# Patient Record
Sex: Female | Born: 1960
Health system: Southern US, Community
[De-identification: ages and names within clinical notes are randomized; demographics above are authoritative.]

## PROBLEM LIST (undated history)

## (undated) DIAGNOSIS — I10 Essential (primary) hypertension: Secondary | ICD-10-CM

## (undated) DIAGNOSIS — F329 Major depressive disorder, single episode, unspecified: Secondary | ICD-10-CM

## (undated) DIAGNOSIS — F32A Depression, unspecified: Secondary | ICD-10-CM

## (undated) DIAGNOSIS — I82409 Acute embolism and thrombosis of unspecified deep veins of unspecified lower extremity: Secondary | ICD-10-CM

## (undated) DIAGNOSIS — E785 Hyperlipidemia, unspecified: Secondary | ICD-10-CM

## (undated) DIAGNOSIS — IMO0001 Reserved for inherently not codable concepts without codable children: Secondary | ICD-10-CM

## (undated) DIAGNOSIS — F419 Anxiety disorder, unspecified: Secondary | ICD-10-CM

## (undated) DIAGNOSIS — E039 Hypothyroidism, unspecified: Secondary | ICD-10-CM

## (undated) HISTORY — DX: Essential (primary) hypertension: I10

## (undated) HISTORY — DX: Hyperlipidemia, unspecified: E78.5

## (undated) HISTORY — PX: HERNIA REPAIR: SHX51

## (undated) HISTORY — PX: BACK SURGERY: SHX140

---

## 1991-06-02 HISTORY — PX: KNEE SURGERY: SHX244

## 2008-03-05 ENCOUNTER — Ambulatory Visit: Payer: Self-pay | Admitting: Obstetrics and Gynecology

## 2009-03-21 ENCOUNTER — Ambulatory Visit: Payer: Self-pay | Admitting: Obstetrics and Gynecology

## 2009-03-26 ENCOUNTER — Ambulatory Visit: Payer: Self-pay | Admitting: Obstetrics and Gynecology

## 2009-03-27 ENCOUNTER — Ambulatory Visit: Payer: Self-pay | Admitting: Obstetrics and Gynecology

## 2010-04-03 ENCOUNTER — Ambulatory Visit: Payer: Self-pay | Admitting: Nurse Practitioner

## 2010-05-02 ENCOUNTER — Ambulatory Visit: Payer: Self-pay | Admitting: Gastroenterology

## 2011-04-30 ENCOUNTER — Ambulatory Visit: Payer: Self-pay

## 2011-05-08 ENCOUNTER — Ambulatory Visit: Payer: Self-pay

## 2012-12-20 ENCOUNTER — Ambulatory Visit: Payer: Self-pay | Admitting: Obstetrics and Gynecology

## 2013-12-21 ENCOUNTER — Ambulatory Visit: Payer: Self-pay | Admitting: Obstetrics and Gynecology

## 2014-03-12 ENCOUNTER — Ambulatory Visit: Payer: Self-pay

## 2014-03-19 ENCOUNTER — Ambulatory Visit: Payer: Self-pay

## 2014-03-26 ENCOUNTER — Ambulatory Visit: Payer: Self-pay

## 2014-04-09 ENCOUNTER — Ambulatory Visit: Payer: Self-pay

## 2014-04-24 ENCOUNTER — Ambulatory Visit: Payer: Self-pay

## 2014-12-19 ENCOUNTER — Other Ambulatory Visit: Payer: Self-pay | Admitting: Obstetrics and Gynecology

## 2014-12-19 DIAGNOSIS — Z1231 Encounter for screening mammogram for malignant neoplasm of breast: Secondary | ICD-10-CM

## 2014-12-19 DIAGNOSIS — Z13820 Encounter for screening for osteoporosis: Secondary | ICD-10-CM

## 2015-01-08 ENCOUNTER — Ambulatory Visit
Admission: RE | Admit: 2015-01-08 | Discharge: 2015-01-08 | Disposition: A | Discharge status: Home / Self Care | Payer: 59 | Source: Ambulatory Visit | Attending: Obstetrics and Gynecology | Admitting: Obstetrics and Gynecology

## 2015-01-08 DIAGNOSIS — Z1231 Encounter for screening mammogram for malignant neoplasm of breast: Secondary | ICD-10-CM | POA: Diagnosis not present

## 2015-04-11 ENCOUNTER — Other Ambulatory Visit: Payer: Self-pay | Admitting: Neurological Surgery

## 2015-04-29 ENCOUNTER — Telehealth: Payer: Self-pay | Admitting: Vascular Surgery

## 2015-04-29 NOTE — Telephone Encounter (Addendum)
-----   Message from Denman George, RN sent at 04/24/2015  6:04 PM EST ----- Regarding: needs office consult with Dr. Zara Chess: [phone] Please schedule new pt. Consult with Dr. Scot Dock, prior to ALIF scheduled on 06/03/14.  Please remind pt. to bring copy of LS spine films with her.    notified patient of appt. on 05-22-15 at 10:00 for a consult with dr. Scot Dock, reminded her to bring  ls spine films, mailed np info

## 2015-05-01 ENCOUNTER — Other Ambulatory Visit: Payer: Self-pay

## 2015-05-07 ENCOUNTER — Encounter: Payer: Self-pay | Admitting: Vascular Surgery

## 2015-05-17 ENCOUNTER — Ambulatory Visit
Admission: RE | Admit: 2015-05-17 | Discharge: 2015-05-17 | Disposition: A | Discharge status: Home / Self Care | Payer: 59 | Source: Ambulatory Visit | Attending: Obstetrics and Gynecology | Admitting: Obstetrics and Gynecology

## 2015-05-17 ENCOUNTER — Encounter: Payer: Self-pay | Admitting: Vascular Surgery

## 2015-05-17 DIAGNOSIS — R002 Palpitations: Secondary | ICD-10-CM | POA: Insufficient documentation

## 2015-05-22 ENCOUNTER — Encounter: Payer: Self-pay | Admitting: Vascular Surgery

## 2015-05-22 ENCOUNTER — Ambulatory Visit (INDEPENDENT_AMBULATORY_CARE_PROVIDER_SITE_OTHER): Payer: Worker's Compensation | Admitting: Vascular Surgery

## 2015-05-22 VITALS — BP 132/71 | HR 75 | Temp 98.9°F | Resp 16 | Ht 64.5 in | Wt 137.0 lb

## 2015-05-22 DIAGNOSIS — M5137 Other intervertebral disc degeneration, lumbosacral region: Secondary | ICD-10-CM

## 2015-05-22 NOTE — Progress Notes (Signed)
[facility] [place]  Patient name: Ashley Hurst MRN: [id number] DOB: January 28, 1961 Sex: female  REASON FOR CONSULT: Evaluate for anterior retroperitoneal approach to L4-L5 and L5-S1. Referred by Dr. Kristeen Miss.  HPI: Ashley Hurst is a 54 y.o. female, who injured her back at work on 03/12/2014. She has failed conservative treatment and is now being considered for anterior lumbar interbody fusion at the L4-L5 and L5-S1 level. She has significant low back pain which is fairly constant and is aggravated by activity and relieved somewhat with rest. Her back pain has significantly limited her ability to work.  I have reviewed the records from Dr. Clarice Pole office. She has disc degenerative changes at L4-L5 and L5-S1.  She's had a previous umbilical hernia repair.  Her risk factors for peripheral vascular disease include hypertension and hyperlipidemia. She denies any history of diabetes, family history of premature cardiovascular disease, or tobacco use.  Past Medical History  Diagnosis Date  . Hypertension   . Hyperlipidemia     No family history on file.  There is no family history of premature cardiovascular disease.  SOCIAL HISTORY: Social History   Social History  . Marital Status: Single    Spouse Name: N/A  . Number of Children: N/A  . Years of Education: N/A   Occupational History  . Not on file.   Social History Main Topics  . Smoking status: Not on file  . Smokeless tobacco: Not on file  . Alcohol Use: Not on file  . Drug Use: Not on file  . Sexual Activity: Not on file   Other Topics Concern  . Not on file   Social History Narrative    Not on File  Current Outpatient Prescriptions  Medication Sig Dispense Refill  . ALPRAZolam (XANAX) 1 MG tablet Take 0.5 mg by mouth 2 (two) times daily. No frequency of medications is noted in list provided by Dr Landis Gandy  [facility]    . aspirin EC 81 MG tablet Take 81 mg by  mouth every morning.    . busPIRone (BUSPAR) 10 MG tablet Take 10 mg by mouth 2 (two) times daily.    . citalopram (CELEXA) 20 MG tablet Take 20 mg by mouth every morning.  3  . cyanocobalamin 1000 MCG tablet Take 1,000 mcg by mouth daily.    Marland Kitchen estradiol (ESTRACE) 1 MG tablet Take 1 mg by mouth daily.     . ferrous sulfate 325 (65 FE) MG tablet Take 325 mg by mouth daily.  1  . fluticasone (FLONASE) 50 MCG/ACT nasal spray Place 1 spray into both nostrils daily.     Marland Kitchen levothyroxine (SYNTHROID, LEVOTHROID) 50 MCG tablet Take 50 mcg by mouth daily before breakfast.    . lisinopril-hydrochlorothiazide (PRINZIDE,ZESTORETIC) 20-25 MG tablet Take 1 tablet by mouth daily.     . meloxicam (MOBIC) 15 MG tablet Take 15 mg by mouth daily.     . Multiple Vitamins-Minerals (COMPLETE MULTIVITAMIN/MINERAL PO) Take 1 tablet by mouth every morning.    . Omega-3 Fatty Acids (FISH OIL) 600 MG CAPS Take 600 mg by mouth daily.    . traMADol (ULTRAM) 50 MG tablet Take by mouth every 6 (six) hours as needed.  3  . zolpidem (AMBIEN) 10 MG tablet Take 10 mg by mouth at bedtime.      No current facility-administered medications for this visit.    REVIEW OF SYSTEMS:  [X]  denotes positive finding, [ ]  denotes negative  finding Cardiac  Comments:  Chest pain or chest pressure:    Shortness of breath upon exertion:    Short of breath when lying flat:    Irregular heart rhythm:        Vascular    Pain in calf, thigh, or hip brought on by ambulation:    Pain in feet at night that wakes you up from your sleep:     Blood clot in your veins:    Leg swelling:  X       Pulmonary    Oxygen at home:    Productive cough:     Wheezing:         Neurologic    Sudden weakness in arms or legs:     Sudden numbness in arms or legs:     Sudden onset of difficulty speaking or slurred speech:    Temporary loss of vision in one eye:     Problems with dizziness:         Gastrointestinal    Blood in stool:     Vomited blood:          Genitourinary    Burning when urinating:     Blood in urine:        Psychiatric    Major depression:         Hematologic    Bleeding problems:    Problems with blood clotting too easily:        Skin    Rashes or ulcers:        Constitutional    Fever or chills:      PHYSICAL EXAM: There were no vitals filed for this visit.  GENERAL: The patient is a well-nourished female, in no acute distress. The vital signs are documented above. CARDIAC: There is a regular rate and rhythm.  VASCULAR: I do not detect carotid bruits. He has palpable femoral, popliteal, and pedal pulses bilaterally. PULMONARY: There is good air exchange bilaterally without wheezing or rales. ABDOMEN: Soft and non-tender with normal pitched bowel sounds.  MUSCULOSKELETAL: There are no major deformities or cyanosis. NEUROLOGIC: No focal weakness or paresthesias are detected. SKIN: There are no ulcers or rashes noted. PSYCHIATRIC: The patient has a normal affect.  DATA:  I have reviewed her x-rays which she brought with her which show degenerative changes at L4-L5 and L5-S1.  MEDICAL ISSUES:  DEGENERATIVE DISC DISEASE L4-L5 AND L5-S1: The patient appears to be a reasonable candidate for anterior retroperitoneal exposure for ALIF. I have reviewed our role in exposure of the spine in order to allow anterior lumbar interbody fusion at the appropriate levels. We have discussed the potential complications of surgery, including but not limited to, arterial or venous injury, thrombosis, or bleeding. We have also discussed the potential risks of wound healing problems, the development of a hernia, nerve injury, leg swelling, or other unpredictable medical problems. All the patient's questions were answered and they are agreeable to proceed. Her surgery is scheduled for 06/04/2015.  [name] [facility] [place]: [phone]

## 2015-05-23 ENCOUNTER — Encounter (HOSPITAL_COMMUNITY)
Admission: RE | Admit: 2015-05-23 | Discharge: 2015-05-23 | Disposition: A | Discharge status: Home / Self Care | Payer: Worker's Compensation | Source: Ambulatory Visit | Attending: Neurological Surgery | Admitting: Neurological Surgery

## 2015-05-23 ENCOUNTER — Encounter (HOSPITAL_COMMUNITY): Payer: Self-pay

## 2015-05-23 DIAGNOSIS — Z01818 Encounter for other preprocedural examination: Secondary | ICD-10-CM | POA: Diagnosis not present

## 2015-05-23 DIAGNOSIS — E039 Hypothyroidism, unspecified: Secondary | ICD-10-CM | POA: Insufficient documentation

## 2015-05-23 DIAGNOSIS — F419 Anxiety disorder, unspecified: Secondary | ICD-10-CM | POA: Insufficient documentation

## 2015-05-23 DIAGNOSIS — E785 Hyperlipidemia, unspecified: Secondary | ICD-10-CM | POA: Diagnosis not present

## 2015-05-23 DIAGNOSIS — Z79899 Other long term (current) drug therapy: Secondary | ICD-10-CM | POA: Diagnosis not present

## 2015-05-23 DIAGNOSIS — F329 Major depressive disorder, single episode, unspecified: Secondary | ICD-10-CM | POA: Insufficient documentation

## 2015-05-23 DIAGNOSIS — M5416 Radiculopathy, lumbar region: Secondary | ICD-10-CM | POA: Diagnosis not present

## 2015-05-23 DIAGNOSIS — Z7982 Long term (current) use of aspirin: Secondary | ICD-10-CM | POA: Insufficient documentation

## 2015-05-23 DIAGNOSIS — Z8249 Family history of ischemic heart disease and other diseases of the circulatory system: Secondary | ICD-10-CM | POA: Diagnosis not present

## 2015-05-23 DIAGNOSIS — Z01812 Encounter for preprocedural laboratory examination: Secondary | ICD-10-CM | POA: Insufficient documentation

## 2015-05-23 DIAGNOSIS — I10 Essential (primary) hypertension: Secondary | ICD-10-CM | POA: Diagnosis not present

## 2015-05-23 HISTORY — DX: Hypothyroidism, unspecified: E03.9

## 2015-05-23 HISTORY — DX: Reserved for inherently not codable concepts without codable children: IMO0001

## 2015-05-23 HISTORY — DX: Anxiety disorder, unspecified: F41.9

## 2015-05-23 HISTORY — DX: Major depressive disorder, single episode, unspecified: F32.9

## 2015-05-23 HISTORY — DX: Depression, unspecified: F32.A

## 2015-05-23 LAB — BASIC METABOLIC PANEL
Anion gap: 8 (ref 5–15)
BUN: 11 mg/dL (ref 6–20)
CHLORIDE: 104 mmol/L (ref 101–111)
CO2: 27 mmol/L (ref 22–32)
CREATININE: 0.64 mg/dL (ref 0.44–1.00)
Calcium: 9.5 mg/dL (ref 8.9–10.3)
GFR calc Af Amer: >60 mL/min (ref >60)
GLUCOSE: 100 mg/dL — AB (ref 65–99)
POTASSIUM: 3.8 mmol/L (ref 3.5–5.1)
SODIUM: 139 mmol/L (ref 135–145)

## 2015-05-23 LAB — CBC
HCT: 34.9 % — ABNORMAL LOW (ref 36.0–46.0)
Hemoglobin: 10.8 g/dL — ABNORMAL LOW (ref 12.0–15.0)
MCH: 29.1 pg (ref 26.0–34.0)
MCHC: 30.9 g/dL (ref 30.0–36.0)
MCV: 94.1 fL (ref 78.0–100.0)
PLATELETS: 205 10*3/uL (ref 150–400)
RBC: 3.71 MIL/uL — AB (ref 3.87–5.11)
RDW: 15.6 % — ABNORMAL HIGH (ref 11.5–15.5)
WBC: 5.6 10*3/uL (ref 4.0–10.5)

## 2015-05-23 LAB — SURGICAL PCR SCREEN
MRSA, PCR: POSITIVE — AB
Staphylococcus aureus: POSITIVE — AB

## 2015-05-23 MED ORDER — CHLORHEXIDINE GLUCONATE 4 % EX LIQD: 60.0000 mL | Freq: Once | CUTANEOUS | Status: DC

## 2015-05-23 NOTE — Pre-Procedure Instructions (Signed)
    Lisbeth Renshaw Rumbold  05/23/2015      [id number] [address], [place] [address] Phone: [phone] Fax: [phone]    Your procedure is scheduled on 06/03/14.  Report to [facility] Admitting at 530 A.M.  Call this number if you have problems the morning of surgery:  [phone]   Remember:  Do not eat food or drink liquids after midnight.  Take these medicines the morning of surgery with A SIP OF WATER --celexa,estrace,flonase,synthroid,ultram   Do not wear jewelry, make-up or nail polish.  Do not wear lotions, powders, or perfumes.  You may wear deodorant.  Do not shave 48 hours prior to surgery.  Men may shave face and neck.  Do not bring valuables to the hospital.  [facility] is not responsible for any belongings or valuables.  Contacts, dentures or bridgework may not be worn into surgery.  Leave your suitcase in the car.  After surgery it may be brought to your room.  For patients admitted to the hospital, discharge time will be determined by your treatment team.  Patients discharged the day of surgery will not be allowed to drive home.   Name and phone number of your driver:   Special instructions:    Please read over the following fact sheets that you were given. Pain Booklet, Coughing and Deep Breathing, MRSA Information and Surgical Site Infection Prevention

## 2015-05-23 NOTE — Progress Notes (Signed)
Pt recently had ekg. She had some SOB. But it was possibly a panic attack. Shelby Dubin to review ekg. Pt would like a phone call if there is aproblem,as she has not hear from PCP.

## 2015-05-24 NOTE — Progress Notes (Addendum)
Anesthesia Chart Review: Patient is a 53 year old female scheduled for L4-5, L5-S1 artifical disc replacement  (Dr. Ellene Route) with anterior approach (Dr. Scot Dock) on 06/04/15. OR room is booked from [phone]. Case is workman's comp. Injury occurred 15 month ago. 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   History includes nonsmoker, hypertension, hyperlipidemia, hypothyroidism, depression, anxiety, shortness of breath, umbilical hernia repair, knee surgery. She is perimenapausal with irregular, heavy menses. She was recently started on iron for "low iron." She reported an episode of acute palpitations/heart racing associated with SOB on Thanksgiving day while vacuuming. She denied chest pain. She had to rest. All together symptoms lasted approximately four hours. She did not seek immediate medical attention, but mentioned it to her GYN Dr. Elgie Collard at her 05/07/15 follow-up. She ordered an EKG which was down at [facility] out-patient on 05/17/15. Patient has never heard back from Dr. Georga Bora regarding the EKG results but said there was mention of a potential need for stress test depending on findings. Patient uses her GYN as a PCP.   Meds include Xanax, aspirin 81 mg, Buspar, Celexa, estradiol, ferrous sulfate, Flonase, levothyroxine, lisinopril-hydrochlorothiazide, fish oil, tramadol, Ambien.  PAT Vitals: T 36.7C, HR 78, BP 118/55, RR 18, O2 sat 98%.  05/17/15 EKG: NSR, LAD, RSR prime or QR pattern in V1 suggests RV conduction delay, septal infarct (age undetermined). No previous tracing in [place] or [place]. Patient denied prior EKG or other cardiac studies. She reports no further tachycardia/SOB episodes since Thanksgiving. No CP. No syncope. Her LLE tends to get swollen with prolonged standing since her injury last year. Reports she has still been able to be fairly active, and was able to use her leaf blower for about 30 minutes last  week. Prior to Labor Day, she was swimming regularly. Her family history includes a father who had HTN and what sounds like metastatic cancer but died at age 68 of a MI. Her mother died from cancer and complications from a fall. Her brother died from complications of drug abuse. She has one living sister.   Preoperative labs noted. H/H 10.8/34.9. Glucose 100. Cr 0.64.   Discussed with anesthesiologist Dr. Therisa Doyne. Patient is scheduled for a 6+ hour surgery. CAD risk factors include HTN, HLD, family history of CAD. She had tachycardia event with SOB on Thanksgiving Day, but did not seek immediate attention. Symptoms have not recurred. Recent EKG shows septal Q waves (consider infarct, or may be related to incomplete RBBB pattern). She also has some sporadic T wave inversion. There is also question that Dr. Georga Bora said she may need a stress test. We would be inclined to recommend preoperative stress test (or either cardiology evaluation). I will attempt to contact Dr. Suszanne Finch on 05/28/15 when her office reopens to discuss further. Dr. Clarice Pole office is now closed, so I'll plan to update him as well next week.  George Hugh [facility] Short Stay Center/Anesthesiology Phone [phone] 05/24/2015 4:49 PM  Addendum: I attempted to call and speak with Dr. Georga Bora this morning, but could not get an answer. I did fax my note along with recent EKG tracing for her review. This afternoon, I received a message from [place], [place] with PAT stating that Dr. Georga Bora' office had called back and wanted [place] to arrange cardiology follow-up. I have notified Jessica at Dr. Clarice Pole office. She will make a referral.   Myra Gianotti, PA-C [facility] Short Stay Center/Anesthesiology Phone (  [phone] 05/28/2015 5:20 PM

## 2015-05-30 ENCOUNTER — Other Ambulatory Visit: Payer: Self-pay | Admitting: Cardiology

## 2015-05-30 DIAGNOSIS — R079 Chest pain, unspecified: Secondary | ICD-10-CM

## 2015-05-31 ENCOUNTER — Encounter (HOSPITAL_COMMUNITY): Admission: RE | Admit: 2015-05-31 | Payer: Worker's Compensation | Source: Ambulatory Visit

## 2015-05-31 ENCOUNTER — Encounter (HOSPITAL_COMMUNITY)
Admission: RE | Admit: 2015-05-31 | Discharge: 2015-05-31 | Disposition: A | Discharge status: Home / Self Care | Payer: Worker's Compensation | Source: Ambulatory Visit | Attending: Cardiology | Admitting: Cardiology

## 2015-05-31 DIAGNOSIS — R079 Chest pain, unspecified: Secondary | ICD-10-CM | POA: Insufficient documentation

## 2015-05-31 MED ORDER — TECHNETIUM TC 99M SESTAMIBI GENERIC - CARDIOLITE
30.0000 | Freq: Once | INTRAVENOUS | Status: AC | PRN
  Administered 2015-05-31: 30 via INTRAVENOUS

## 2015-05-31 MED ORDER — REGADENOSON 0.4 MG/5ML IV SOLN
INTRAVENOUS | Status: AC
  Filled 2015-05-31: qty 5

## 2015-05-31 MED ORDER — TECHNETIUM TC 99M SESTAMIBI GENERIC - CARDIOLITE
10.0000 | Freq: Once | INTRAVENOUS | Status: AC | PRN
  Administered 2015-05-31: 10 via INTRAVENOUS

## 2015-05-31 MED ORDER — REGADENOSON 0.4 MG/5ML IV SOLN
0.4000 mg | Freq: Once | INTRAVENOUS | Status: AC
  Administered 2015-05-31: 0.4 mg via INTRAVENOUS

## 2015-05-31 NOTE — Progress Notes (Addendum)
Anesthesia Chart Review: Patient is a 54 year old female scheduled for L4-5, L5-S1 artifical disc replacement  (Dr. Ellene Route) with anterior approach (Dr. Scot Dock) on 06/04/15. OR room is booked from [phone]. Case is workman's comp. Injury occurred 15 month ago. 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   History includes nonsmoker, hypertension, hyperlipidemia, hypothyroidism, depression, anxiety, shortness of breath, umbilical hernia repair, knee surgery. She is perimenapausal with irregular, heavy menses. She was recently started on iron for "low iron." She reported an episode of acute palpitations/heart racing associated with SOB on Thanksgiving day while vacuuming. She denied chest pain. She had to rest. All together symptoms lasted approximately four hours. She did not seek immediate medical attention, but mentioned it to her GYN Dr. Elgie Collard at her 05/07/15 follow-up. She ordered an EKG which was down at [facility] out-patient on 05/17/15. Patient has never heard back from Dr. Georga Bora regarding the EKG results but said there was mention of a potential need for stress test depending on findings. Patient uses her GYN as a PCP.   Meds include Xanax, aspirin 81 mg, Buspar, Celexa, estradiol, ferrous sulfate, Flonase, levothyroxine, lisinopril-hydrochlorothiazide, fish oil, tramadol, Ambien.  PAT Vitals: T 36.7C, HR 78, BP 118/55, RR 18, O2 sat 98%.  05/17/15 EKG: NSR, LAD, RSR prime or QR pattern in V1 suggests RV conduction delay, septal infarct (age undetermined). No previous tracing in [place] or [place]. Patient denied prior EKG or other cardiac studies. She reports no further tachycardia/SOB episodes since Thanksgiving. No CP. No syncope. Her LLE tends to get swollen with prolonged standing since her injury last year. Reports she has still been able to be fairly active, and was able to use her leaf blower for about 30 minutes last  week. Prior to Labor Day, she was swimming regularly. Her family history includes a father who had HTN and what sounds like metastatic cancer but died at age 20 of a MI. Her mother died from cancer and complications from a fall. Her brother died from complications of drug abuse. She has one living sister.   Preoperative labs noted. H/H 10.8/34.9. Glucose 100. Cr 0.64.   Discussed with anesthesiologist Dr. Therisa Doyne. Patient is scheduled for a 6+ hour surgery. CAD risk factors include HTN, HLD, family history of CAD. She had tachycardia event with SOB on Thanksgiving Day, but did not seek immediate attention. Symptoms have not recurred. Recent EKG shows septal Q waves (consider infarct, or may be related to incomplete RBBB pattern). She also has some sporadic T wave inversion. There is also question that Dr. Georga Bora said she may need a stress test. We would be inclined to recommend preoperative stress test (or either cardiology evaluation). I will attempt to contact Dr. Suszanne Finch on 05/28/15 when her office reopens to discuss further. Dr. Clarice Pole office is now closed, so I'll plan to update him as well next week.  George Hugh [facility] Short Stay Center/Anesthesiology Phone [phone] 05/31/2015 3:25 PM  Addendum: I attempted to call and speak with Dr. Georga Bora this morning, but could not get an answer. I did fax my note along with recent EKG tracing for her review. This afternoon, I received a message from [place], [place] with PAT stating that Dr. Georga Bora' office had called back and wanted [place] to arrange cardiology follow-up. I have notified Jessica at Dr. Clarice Pole office. She will make a referral.   Myra Gianotti, PA-C [facility] Short Stay Center/Anesthesiology Phone (  [phone] 05/31/2015 3:25 PM  Addendum: Patient was seen by cardiologist Dr. Einar Gip this week. He cleared her following a recent stress test done at [facility] (see below).  05/31/15 Nuclear stress test:  IMPRESSION: 1. No reversible ischemia or infarction. 2. Normal left ventricular wall motion. 3. Left ventricular ejection fraction 75% 4. Low-risk stress test findings*.  *2012 Appropriate Use Criteria for Coronary Revascularization Focused Update: J Am Coll Cardiol. N6492421. http://content.airportbarriers.com.aspx?articleid=1201161  George Hugh [facility] Short Stay Center/Anesthesiology Phone [phone] 05/31/2015 3:26 PM

## 2015-05-31 NOTE — Progress Notes (Signed)
Spoke Ashley Hurst at Dr Clarice Pole office and she stated pt is having ECHO now and she is following up and in contact with Ebony Hail.

## 2015-06-01 LAB — NM MYOCAR MULTI W/SPECT W/WALL MOTION / EF
CHL CUP STRESS STAGE 1 HR: 72 {beats}/min
CHL CUP STRESS STAGE 1 SBP: 146 mmHg
CHL CUP STRESS STAGE 2 HR: 71 {beats}/min
CHL CUP STRESS STAGE 3 HR: 80 {beats}/min
CHL CUP STRESS STAGE 3 SBP: 134 mmHg
CHL CUP STRESS STAGE 3 SPEED: 0 mph
CHL CUP STRESS STAGE 4 GRADE: 0 %
CHL CUP STRESS STAGE 4 HR: 81 {beats}/min
CHL CUP STRESS STAGE 5 HR: 79 {beats}/min
CHL CUP STRESS STAGE 6 GRADE: 0 %
CHL CUP STRESS STAGE 6 HR: 78 {beats}/min
CHL CUP STRESS STAGE 6 SBP: 133 mmHg
CHL CUP STRESS STAGE 6 SPEED: 0 mph
CSEPEW: 1 METS
CSEPPMHR: 49 %
Peak HR: 81 {beats}/min
Stage 1 DBP: 86 mmHg
Stage 1 Grade: 0 %
Stage 1 Speed: 0 mph
Stage 2 Grade: 0 %
Stage 2 Speed: 0 mph
Stage 3 DBP: 81 mmHg
Stage 3 Grade: 0 %
Stage 4 Speed: 0 mph
Stage 5 Grade: 0 %
Stage 5 Speed: 0 mph
Stage 6 DBP: 79 mmHg

## 2015-06-03 MED ORDER — CEFAZOLIN SODIUM-DEXTROSE 2-3 GM-% IV SOLR
2.0000 g | INTRAVENOUS | Status: AC
  Administered 2015-06-04 (×2): 2 g via INTRAVENOUS
  Filled 2015-06-03: qty 50

## 2015-06-04 ENCOUNTER — Encounter (HOSPITAL_COMMUNITY): Payer: Self-pay | Admitting: Anesthesiology

## 2015-06-04 ENCOUNTER — Inpatient Hospital Stay (HOSPITAL_COMMUNITY): Payer: Worker's Compensation

## 2015-06-04 ENCOUNTER — Encounter (HOSPITAL_COMMUNITY)
Admission: RE | Disposition: A | Discharge status: Home / Self Care | Payer: PRIVATE HEALTH INSURANCE | Source: Ambulatory Visit | Attending: Neurological Surgery

## 2015-06-04 ENCOUNTER — Inpatient Hospital Stay (HOSPITAL_COMMUNITY)
Admission: RE | Admit: 2015-06-04 | Discharge: 2015-06-07 | DRG: 518 | Disposition: A | Discharge status: Home / Self Care | Payer: Worker's Compensation | Source: Ambulatory Visit | Attending: Neurological Surgery | Admitting: Neurological Surgery

## 2015-06-04 ENCOUNTER — Inpatient Hospital Stay (HOSPITAL_COMMUNITY): Payer: Worker's Compensation | Admitting: Anesthesiology

## 2015-06-04 ENCOUNTER — Inpatient Hospital Stay (HOSPITAL_COMMUNITY): Payer: Worker's Compensation | Admitting: Vascular Surgery

## 2015-06-04 DIAGNOSIS — I10 Essential (primary) hypertension: Secondary | ICD-10-CM | POA: Diagnosis present

## 2015-06-04 DIAGNOSIS — Z7982 Long term (current) use of aspirin: Secondary | ICD-10-CM

## 2015-06-04 DIAGNOSIS — M549 Dorsalgia, unspecified: Secondary | ICD-10-CM | POA: Diagnosis present

## 2015-06-04 DIAGNOSIS — E785 Hyperlipidemia, unspecified: Secondary | ICD-10-CM | POA: Diagnosis present

## 2015-06-04 DIAGNOSIS — M5136 Other intervertebral disc degeneration, lumbar region: Secondary | ICD-10-CM | POA: Diagnosis not present

## 2015-06-04 DIAGNOSIS — M4727 Other spondylosis with radiculopathy, lumbosacral region: Secondary | ICD-10-CM | POA: Diagnosis present

## 2015-06-04 DIAGNOSIS — M5117 Intervertebral disc disorders with radiculopathy, lumbosacral region: Principal | ICD-10-CM | POA: Diagnosis present

## 2015-06-04 DIAGNOSIS — I739 Peripheral vascular disease, unspecified: Secondary | ICD-10-CM | POA: Diagnosis present

## 2015-06-04 DIAGNOSIS — M5416 Radiculopathy, lumbar region: Secondary | ICD-10-CM | POA: Diagnosis present

## 2015-06-04 DIAGNOSIS — D62 Acute posthemorrhagic anemia: Secondary | ICD-10-CM | POA: Diagnosis not present

## 2015-06-04 HISTORY — PX: ANTERIOR LUMBAR DISC ARTHROPLASTY: SHX5721

## 2015-06-04 HISTORY — PX: ABDOMINAL EXPOSURE: SHX5708

## 2015-06-04 LAB — CBC
HCT: 22.5 % — ABNORMAL LOW (ref 36.0–46.0)
HEMOGLOBIN: 7 g/dL — AB (ref 12.0–15.0)
MCH: 29 pg (ref 26.0–34.0)
MCHC: 31.1 g/dL (ref 30.0–36.0)
MCV: 93.4 fL (ref 78.0–100.0)
Platelets: 122 10*3/uL — ABNORMAL LOW (ref 150–400)
RBC: 2.41 MIL/uL — AB (ref 3.87–5.11)
RDW: 14.8 % (ref 11.5–15.5)
WBC: 8.2 10*3/uL (ref 4.0–10.5)

## 2015-06-04 LAB — ABO/RH: ABO/RH(D): A NEG

## 2015-06-04 SURGERY — ANTERIOR LUMBAR DISC ARTHROPLASTY
Anesthesia: General | Site: Abdomen

## 2015-06-04 MED ORDER — FLUTICASONE PROPIONATE 50 MCG/ACT NA SUSP: 1.0000 | Freq: Every day | NASAL | Status: DC

## 2015-06-04 MED ORDER — ONDANSETRON HCL 4 MG/2ML IJ SOLN: 4.0000 mg | INTRAMUSCULAR | Status: DC | PRN

## 2015-06-04 MED ORDER — TRAMADOL HCL 50 MG PO TABS: 50.0000 mg | ORAL_TABLET | Freq: Four times a day (QID) | ORAL | Status: DC | PRN

## 2015-06-04 MED ORDER — HEMOSTATIC AGENTS (NO CHARGE) OPTIME
TOPICAL | Status: DC | PRN
  Administered 2015-06-04: 1 via TOPICAL

## 2015-06-04 MED ORDER — BUSPIRONE HCL 10 MG PO TABS
10.0000 mg | ORAL_TABLET | Freq: Two times a day (BID) | ORAL | Status: DC
  Administered 2015-06-05 – 2015-06-07 (×5): 10 mg via ORAL
  Filled 2015-06-04 (×7): qty 1

## 2015-06-04 MED ORDER — FENTANYL CITRATE (PF) 250 MCG/5ML IJ SOLN
INTRAMUSCULAR | Status: AC
  Filled 2015-06-04: qty 5

## 2015-06-04 MED ORDER — OXYCODONE HCL 5 MG PO TABS: 5.0000 mg | ORAL_TABLET | Freq: Once | ORAL | Status: DC | PRN

## 2015-06-04 MED ORDER — SODIUM CHLORIDE 0.9 % IJ SOLN
3.0000 mL | Freq: Two times a day (BID) | INTRAMUSCULAR | Status: DC
  Administered 2015-06-04 – 2015-06-06 (×5): 3 mL via INTRAVENOUS

## 2015-06-04 MED ORDER — ESTRADIOL 1 MG PO TABS
1.0000 mg | ORAL_TABLET | Freq: Every day | ORAL | Status: DC
  Administered 2015-06-06 – 2015-06-07 (×2): 1 mg via ORAL
  Filled 2015-06-04 (×3): qty 1

## 2015-06-04 MED ORDER — SODIUM CHLORIDE 0.9 % IV SOLN
INTRAVENOUS | Status: DC
  Administered 2015-06-04 – 2015-06-05 (×3): via INTRAVENOUS

## 2015-06-04 MED ORDER — EPHEDRINE SULFATE 50 MG/ML IJ SOLN
INTRAMUSCULAR | Status: AC
  Filled 2015-06-04: qty 1

## 2015-06-04 MED ORDER — OXYCODONE-ACETAMINOPHEN 5-325 MG PO TABS
1.0000 | ORAL_TABLET | ORAL | Status: DC | PRN
  Administered 2015-06-04 – 2015-06-07 (×13): 2 via ORAL
  Filled 2015-06-04 (×13): qty 2

## 2015-06-04 MED ORDER — ROCURONIUM BROMIDE 100 MG/10ML IV SOLN
INTRAVENOUS | Status: DC | PRN
  Administered 2015-06-04: 50 mg via INTRAVENOUS

## 2015-06-04 MED ORDER — ACETAMINOPHEN 325 MG PO TABS: 650.0000 mg | ORAL_TABLET | ORAL | Status: DC | PRN

## 2015-06-04 MED ORDER — CITALOPRAM HYDROBROMIDE 10 MG PO TABS
20.0000 mg | ORAL_TABLET | Freq: Every morning | ORAL | Status: DC
  Administered 2015-06-05 – 2015-06-07 (×3): 20 mg via ORAL
  Filled 2015-06-04: qty 1
  Filled 2015-06-04: qty 2
  Filled 2015-06-04: qty 1
  Filled 2015-06-04: qty 2

## 2015-06-04 MED ORDER — STERILE WATER FOR INJECTION IJ SOLN
INTRAMUSCULAR | Status: AC
  Filled 2015-06-04: qty 10

## 2015-06-04 MED ORDER — LEVOTHYROXINE SODIUM 50 MCG PO TABS
50.0000 ug | ORAL_TABLET | Freq: Every day | ORAL | Status: DC
  Administered 2015-06-05 – 2015-06-07 (×3): 50 ug via ORAL
  Filled 2015-06-04 (×3): qty 1

## 2015-06-04 MED ORDER — VECURONIUM BROMIDE 10 MG IV SOLR
INTRAVENOUS | Status: AC
  Filled 2015-06-04: qty 10

## 2015-06-04 MED ORDER — THROMBIN 5000 UNITS EX SOLR
OROMUCOSAL | Status: DC | PRN
  Administered 2015-06-04: 10 mL via TOPICAL

## 2015-06-04 MED ORDER — SODIUM CHLORIDE 0.9 % IJ SOLN
INTRAMUSCULAR | Status: AC
  Filled 2015-06-04: qty 10

## 2015-06-04 MED ORDER — ACETAMINOPHEN 650 MG RE SUPP: 650.0000 mg | RECTAL | Status: DC | PRN

## 2015-06-04 MED ORDER — OXYCODONE HCL 5 MG/5ML PO SOLN: 5.0000 mg | Freq: Once | ORAL | Status: DC | PRN

## 2015-06-04 MED ORDER — LIDOCAINE HCL (CARDIAC) 20 MG/ML IV SOLN
INTRAVENOUS | Status: DC | PRN
  Administered 2015-06-04: 60 mg via INTRAVENOUS

## 2015-06-04 MED ORDER — SODIUM CHLORIDE 0.9 % IV SOLN
INTRAVENOUS | Status: DC | PRN
  Administered 2015-06-04: 12:00:00 via INTRAVENOUS

## 2015-06-04 MED ORDER — DOCUSATE SODIUM 100 MG PO CAPS
100.0000 mg | ORAL_CAPSULE | Freq: Two times a day (BID) | ORAL | Status: DC
  Administered 2015-06-04 – 2015-06-07 (×6): 100 mg via ORAL
  Filled 2015-06-04 (×7): qty 1

## 2015-06-04 MED ORDER — GLYCOPYRROLATE 0.2 MG/ML IJ SOLN
INTRAMUSCULAR | Status: AC
  Filled 2015-06-04: qty 1

## 2015-06-04 MED ORDER — EPHEDRINE SULFATE 50 MG/ML IJ SOLN
INTRAMUSCULAR | Status: DC | PRN
  Administered 2015-06-04: 5 mg via INTRAVENOUS
  Administered 2015-06-04: 10 mg via INTRAVENOUS
  Administered 2015-06-04: 5 mg via INTRAVENOUS

## 2015-06-04 MED ORDER — DEXAMETHASONE SODIUM PHOSPHATE 10 MG/ML IJ SOLN
INTRAMUSCULAR | Status: DC | PRN
  Administered 2015-06-04: 10 mg via INTRAVENOUS

## 2015-06-04 MED ORDER — PROPOFOL 10 MG/ML IV BOLUS
INTRAVENOUS | Status: DC | PRN
  Administered 2015-06-04: 120 mg via INTRAVENOUS
  Administered 2015-06-04: 80 mg via INTRAVENOUS

## 2015-06-04 MED ORDER — ROCURONIUM BROMIDE 50 MG/5ML IV SOLN
INTRAVENOUS | Status: AC
  Filled 2015-06-04: qty 1

## 2015-06-04 MED ORDER — PROMETHAZINE HCL 25 MG/ML IJ SOLN: 6.2500 mg | INTRAMUSCULAR | Status: DC | PRN

## 2015-06-04 MED ORDER — SODIUM CHLORIDE 0.9 % IV BOLUS (SEPSIS)
1000.0000 mL | Freq: Once | INTRAVENOUS | Status: AC
  Administered 2015-06-04: 1000 mL via INTRAVENOUS

## 2015-06-04 MED ORDER — LACTATED RINGERS IV SOLN
INTRAVENOUS | Status: DC | PRN
  Administered 2015-06-04: 07:00:00 via INTRAVENOUS

## 2015-06-04 MED ORDER — LIDOCAINE HCL (CARDIAC) 20 MG/ML IV SOLN
INTRAVENOUS | Status: AC
  Filled 2015-06-04: qty 5

## 2015-06-04 MED ORDER — MIDAZOLAM HCL 2 MG/2ML IJ SOLN
INTRAMUSCULAR | Status: AC
  Filled 2015-06-04: qty 2

## 2015-06-04 MED ORDER — MELOXICAM 7.5 MG PO TABS
15.0000 mg | ORAL_TABLET | Freq: Every day | ORAL | Status: DC
Start: 2015-06-06 — End: 2015-06-07
  Administered 2015-06-06 – 2015-06-07 (×2): 15 mg via ORAL
  Filled 2015-06-04 (×2): qty 2

## 2015-06-04 MED ORDER — SUGAMMADEX SODIUM 200 MG/2ML IV SOLN
INTRAVENOUS | Status: AC
  Filled 2015-06-04: qty 2

## 2015-06-04 MED ORDER — LABETALOL HCL 5 MG/ML IV SOLN
INTRAVENOUS | Status: DC | PRN
  Administered 2015-06-04: 5 mg via INTRAVENOUS

## 2015-06-04 MED ORDER — LISINOPRIL 20 MG PO TABS
20.0000 mg | ORAL_TABLET | Freq: Every day | ORAL | Status: DC
  Administered 2015-06-04 – 2015-06-07 (×2): 20 mg via ORAL
  Filled 2015-06-04 (×2): qty 1

## 2015-06-04 MED ORDER — BISACODYL 10 MG RE SUPP: 10.0000 mg | Freq: Every day | RECTAL | Status: DC | PRN

## 2015-06-04 MED ORDER — KETOROLAC TROMETHAMINE 15 MG/ML IJ SOLN
15.0000 mg | Freq: Four times a day (QID) | INTRAMUSCULAR | Status: AC
  Administered 2015-06-04 – 2015-06-05 (×5): 15 mg via INTRAVENOUS
  Filled 2015-06-04 (×4): qty 1

## 2015-06-04 MED ORDER — SUGAMMADEX SODIUM 200 MG/2ML IV SOLN
INTRAVENOUS | Status: DC | PRN
  Administered 2015-06-04: 130 mg via INTRAVENOUS

## 2015-06-04 MED ORDER — LORAZEPAM 2 MG/ML IJ SOLN: 0.5000 mg | INTRAMUSCULAR | Status: DC | PRN

## 2015-06-04 MED ORDER — HYDROCHLOROTHIAZIDE 25 MG PO TABS
25.0000 mg | ORAL_TABLET | Freq: Every day | ORAL | Status: DC
  Administered 2015-06-04 – 2015-06-07 (×2): 25 mg via ORAL
  Filled 2015-06-04 (×2): qty 1

## 2015-06-04 MED ORDER — LISINOPRIL-HYDROCHLOROTHIAZIDE 20-25 MG PO TABS: 1.0000 | ORAL_TABLET | Freq: Every day | ORAL | Status: DC

## 2015-06-04 MED ORDER — CEFAZOLIN SODIUM 1-5 GM-% IV SOLN
1.0000 g | Freq: Three times a day (TID) | INTRAVENOUS | Status: AC
  Administered 2015-06-04 – 2015-06-05 (×2): 1 g via INTRAVENOUS
  Filled 2015-06-04 (×2): qty 50

## 2015-06-04 MED ORDER — HYDROMORPHONE HCL 1 MG/ML IJ SOLN
0.2500 mg | INTRAMUSCULAR | Status: DC | PRN
  Administered 2015-06-04: 0.5 mg via INTRAVENOUS

## 2015-06-04 MED ORDER — MENTHOL 3 MG MT LOZG: 1.0000 | LOZENGE | OROMUCOSAL | Status: DC | PRN

## 2015-06-04 MED ORDER — PROPOFOL 10 MG/ML IV BOLUS
INTRAVENOUS | Status: AC
  Filled 2015-06-04: qty 20

## 2015-06-04 MED ORDER — ZOLPIDEM TARTRATE 5 MG PO TABS
5.0000 mg | ORAL_TABLET | Freq: Every day | ORAL | Status: DC
  Administered 2015-06-05 – 2015-06-06 (×2): 5 mg via ORAL
  Filled 2015-06-04 (×2): qty 1

## 2015-06-04 MED ORDER — ALBUMIN HUMAN 5 % IV SOLN
INTRAVENOUS | Status: DC | PRN
  Administered 2015-06-04: 09:00:00 via INTRAVENOUS

## 2015-06-04 MED ORDER — ONDANSETRON HCL 4 MG/2ML IJ SOLN
INTRAMUSCULAR | Status: DC | PRN
  Administered 2015-06-04: 4 mg via INTRAVENOUS

## 2015-06-04 MED ORDER — DEXAMETHASONE SODIUM PHOSPHATE 10 MG/ML IJ SOLN
INTRAMUSCULAR | Status: AC
  Filled 2015-06-04: qty 1

## 2015-06-04 MED ORDER — 0.9 % SODIUM CHLORIDE (POUR BTL) OPTIME
TOPICAL | Status: DC | PRN
  Administered 2015-06-04: 1000 mL

## 2015-06-04 MED ORDER — FLEET ENEMA 7-19 GM/118ML RE ENEM: 1.0000 | ENEMA | Freq: Once | RECTAL | Status: DC | PRN

## 2015-06-04 MED ORDER — LACTATED RINGERS IV SOLN
INTRAVENOUS | Status: DC | PRN
  Administered 2015-06-04 (×3): via INTRAVENOUS

## 2015-06-04 MED ORDER — FENTANYL CITRATE (PF) 100 MCG/2ML IJ SOLN
INTRAMUSCULAR | Status: DC | PRN
  Administered 2015-06-04 (×6): 50 ug via INTRAVENOUS
  Administered 2015-06-04: 100 ug via INTRAVENOUS
  Administered 2015-06-04 (×5): 50 ug via INTRAVENOUS

## 2015-06-04 MED ORDER — LABETALOL HCL 5 MG/ML IV SOLN
INTRAVENOUS | Status: AC
  Filled 2015-06-04: qty 4

## 2015-06-04 MED ORDER — CEFAZOLIN SODIUM-DEXTROSE 2-3 GM-% IV SOLR
INTRAVENOUS | Status: AC
  Filled 2015-06-04: qty 50

## 2015-06-04 MED ORDER — SODIUM CHLORIDE 0.9 % IJ SOLN: 3.0000 mL | INTRAMUSCULAR | Status: DC | PRN

## 2015-06-04 MED ORDER — BACITRACIN 50000 UNITS IM SOLR
INTRAMUSCULAR | Status: DC | PRN
  Administered 2015-06-04: 500 mL

## 2015-06-04 MED ORDER — ONDANSETRON HCL 4 MG/2ML IJ SOLN
INTRAMUSCULAR | Status: AC
  Filled 2015-06-04: qty 2

## 2015-06-04 MED ORDER — SENNA 8.6 MG PO TABS
1.0000 | ORAL_TABLET | Freq: Two times a day (BID) | ORAL | Status: DC
  Administered 2015-06-04 – 2015-06-07 (×6): 8.6 mg via ORAL
  Filled 2015-06-04 (×7): qty 1

## 2015-06-04 MED ORDER — MIDAZOLAM HCL 5 MG/5ML IJ SOLN
INTRAMUSCULAR | Status: DC | PRN
  Administered 2015-06-04: 2 mg via INTRAVENOUS

## 2015-06-04 MED ORDER — THROMBIN 5000 UNITS EX SOLR
CUTANEOUS | Status: DC | PRN
  Administered 2015-06-04 (×2): 5000 [IU] via TOPICAL

## 2015-06-04 MED ORDER — ALUM & MAG HYDROXIDE-SIMETH 200-200-20 MG/5ML PO SUSP: 30.0000 mL | Freq: Four times a day (QID) | ORAL | Status: DC | PRN

## 2015-06-04 MED ORDER — PHENYLEPHRINE 40 MCG/ML (10ML) SYRINGE FOR IV PUSH (FOR BLOOD PRESSURE SUPPORT)
PREFILLED_SYRINGE | INTRAVENOUS | Status: AC
  Filled 2015-06-04: qty 10

## 2015-06-04 MED ORDER — POLYETHYLENE GLYCOL 3350 17 G PO PACK: 17.0000 g | PACK | Freq: Every day | ORAL | Status: DC | PRN

## 2015-06-04 MED ORDER — KETOROLAC TROMETHAMINE 15 MG/ML IJ SOLN
INTRAMUSCULAR | Status: AC
  Administered 2015-06-05: 15 mg via INTRAVENOUS
  Filled 2015-06-04: qty 1

## 2015-06-04 MED ORDER — VECURONIUM BROMIDE 10 MG IV SOLR
INTRAVENOUS | Status: DC | PRN
  Administered 2015-06-04: 2 mg via INTRAVENOUS
  Administered 2015-06-04 (×3): 3 mg via INTRAVENOUS
  Administered 2015-06-04: 2 mg via INTRAVENOUS
  Administered 2015-06-04: 1 mg via INTRAVENOUS

## 2015-06-04 MED ORDER — HYDROMORPHONE HCL 1 MG/ML IJ SOLN
INTRAMUSCULAR | Status: AC
  Filled 2015-06-04: qty 1

## 2015-06-04 MED ORDER — PHENOL 1.4 % MT LIQD: 1.0000 | OROMUCOSAL | Status: DC | PRN

## 2015-06-04 MED ORDER — SUCCINYLCHOLINE CHLORIDE 20 MG/ML IJ SOLN
INTRAMUSCULAR | Status: AC
  Filled 2015-06-04: qty 1

## 2015-06-04 MED ORDER — CHLORHEXIDINE GLUCONATE CLOTH 2 % EX PADS
6.0000 | MEDICATED_PAD | Freq: Every day | CUTANEOUS | Status: DC
  Administered 2015-06-05 – 2015-06-07 (×3): 6 via TOPICAL

## 2015-06-04 MED ORDER — ALPRAZOLAM 0.5 MG PO TABS
0.5000 mg | ORAL_TABLET | Freq: Two times a day (BID) | ORAL | Status: DC
  Administered 2015-06-05 – 2015-06-07 (×5): 0.5 mg via ORAL
  Filled 2015-06-04 (×6): qty 1

## 2015-06-04 MED ORDER — PHENYLEPHRINE HCL 10 MG/ML IJ SOLN
INTRAMUSCULAR | Status: DC | PRN
  Administered 2015-06-04 (×3): 40 ug via INTRAVENOUS

## 2015-06-04 MED FILL — Heparin Sodium (Porcine) Inj 1000 Unit/ML: INTRAMUSCULAR | Qty: 30 | Status: AC

## 2015-06-04 MED FILL — Sodium Chloride IV Soln 0.9%: INTRAVENOUS | Qty: 1000 | Status: AC

## 2015-06-04 MED FILL — Sodium Chloride Irrigation Soln 0.9%: Qty: 3000 | Status: AC

## 2015-06-04 SURGICAL SUPPLY — 90 items
APPLIER CLIP 11 MED OPEN (CLIP) ×3
BAG DECANTER FOR FLEXI CONT (MISCELLANEOUS) ×3 IMPLANT
BNDG GAUZE ELAST 4 BULKY (GAUZE/BANDAGES/DRESSINGS) IMPLANT
BUR BARREL STRAIGHT FLUTE 4.0 (BURR) IMPLANT
BUR MATCHSTICK NEURO 3.0 LAGG (BURR) IMPLANT
CANISTER SUCT 3000ML PPV (MISCELLANEOUS) ×3 IMPLANT
CLIP APPLIE 11 MED OPEN (CLIP) ×1 IMPLANT
COVER BACK TABLE 60X90IN (DRAPES) ×3 IMPLANT
DECANTER SPIKE VIAL GLASS SM (MISCELLANEOUS) ×3 IMPLANT
DERMABOND ADHESIVE PROPEN (GAUZE/BANDAGES/DRESSINGS) ×2
DERMABOND ADVANCED (GAUZE/BANDAGES/DRESSINGS) ×2
DERMABOND ADVANCED .7 DNX12 (GAUZE/BANDAGES/DRESSINGS) ×1 IMPLANT
DERMABOND ADVANCED .7 DNX6 (GAUZE/BANDAGES/DRESSINGS) ×1 IMPLANT
DRAPE C-ARM 42X72 X-RAY (DRAPES) ×6 IMPLANT
DRAPE INCISE IOBAN 66X45 STRL (DRAPES) ×3 IMPLANT
DRAPE LAPAROTOMY T 102X78X121 (DRAPES) ×3 IMPLANT
DRAPE MICROSCOPE LEICA (MISCELLANEOUS) IMPLANT
DRAPE POUCH INSTRU U-SHP 10X18 (DRAPES) ×3 IMPLANT
DRAPE PROXIMA HALF (DRAPES) IMPLANT
DRSG OPSITE POSTOP 4X8 (GAUZE/BANDAGES/DRESSINGS) ×3 IMPLANT
DURAPREP 6ML APPLICATOR 50/CS (WOUND CARE) ×3 IMPLANT
ELECT BLADE 4.0 EZ CLEAN MEGAD (MISCELLANEOUS)
ELECT REM PT RETURN 9FT ADLT (ELECTROSURGICAL) ×3
ELECTRODE BLDE 4.0 EZ CLN MEGD (MISCELLANEOUS) IMPLANT
ELECTRODE REM PT RTRN 9FT ADLT (ELECTROSURGICAL) ×1 IMPLANT
FELT TEFLON 6X6 (MISCELLANEOUS) IMPLANT
GAUZE SPONGE 4X4 12PLY STRL (GAUZE/BANDAGES/DRESSINGS) ×3 IMPLANT
GAUZE SPONGE 4X4 16PLY XRAY LF (GAUZE/BANDAGES/DRESSINGS) IMPLANT
GLOVE BIO SURGEON STRL SZ7.5 (GLOVE) ×6 IMPLANT
GLOVE BIOGEL PI IND STRL 6.5 (GLOVE) ×2 IMPLANT
GLOVE BIOGEL PI IND STRL 8.5 (GLOVE) ×1 IMPLANT
GLOVE BIOGEL PI INDICATOR 6.5 (GLOVE) ×4
GLOVE BIOGEL PI INDICATOR 8.5 (GLOVE) ×2
GLOVE ECLIPSE 8.5 STRL (GLOVE) ×12 IMPLANT
GLOVE EXAM NITRILE LRG STRL (GLOVE) IMPLANT
GLOVE EXAM NITRILE MD LF STRL (GLOVE) IMPLANT
GLOVE EXAM NITRILE XL STR (GLOVE) IMPLANT
GLOVE EXAM NITRILE XS STR PU (GLOVE) IMPLANT
GLOVE SS N UNI LF 6.5 STRL (GLOVE) ×9 IMPLANT
GOWN STRL NON-REIN LRG LVL3 (GOWN DISPOSABLE) ×3 IMPLANT
GOWN STRL REUS W/ TWL LRG LVL3 (GOWN DISPOSABLE) ×4 IMPLANT
GOWN STRL REUS W/ TWL XL LVL3 (GOWN DISPOSABLE) IMPLANT
GOWN STRL REUS W/TWL 2XL LVL3 (GOWN DISPOSABLE) ×6 IMPLANT
GOWN STRL REUS W/TWL LRG LVL3 (GOWN DISPOSABLE) ×8
GOWN STRL REUS W/TWL XL LVL3 (GOWN DISPOSABLE)
HALTER HD/CHIN CERV TRACTION D (MISCELLANEOUS) ×3 IMPLANT
INLAY POLY W/TANTALUM LG 10MM (Inlay) ×6 IMPLANT
INSERT FOGARTY 61MM (MISCELLANEOUS) IMPLANT
INSERT FOGARTY SM (MISCELLANEOUS) IMPLANT
KIT BASIN OR (CUSTOM PROCEDURE TRAY) ×3 IMPLANT
KIT ROOM TURNOVER OR (KITS) ×6 IMPLANT
LOOP VESSEL MAXI BLUE (MISCELLANEOUS) ×3 IMPLANT
LOOP VESSEL MINI RED (MISCELLANEOUS) ×3 IMPLANT
NEEDLE HYPO 22GX1.5 SAFETY (NEEDLE) ×3 IMPLANT
NEEDLE SPNL 22GX3.5 QUINCKE BK (NEEDLE) ×3 IMPLANT
NS IRRIG 1000ML POUR BTL (IV SOLUTION) ×3 IMPLANT
PACK LAMINECTOMY NEURO (CUSTOM PROCEDURE TRAY) ×3 IMPLANT
PAD ARMBOARD 7.5X6 YLW CONV (MISCELLANEOUS) ×15 IMPLANT
PLATE END SUPERIOR LG 11 (Plate) ×3 IMPLANT
PLATE LRG INFERIOR END (Plate) ×6 IMPLANT
PLATE SUPERIOR PRODISC-L 6D (Plate) ×3 IMPLANT
RUBBERBAND STERILE (MISCELLANEOUS) IMPLANT
SPONGE INTESTINAL PEANUT (DISPOSABLE) ×9 IMPLANT
SPONGE LAP 18X18 X RAY DECT (DISPOSABLE) ×3 IMPLANT
SPONGE LAP 4X18 X RAY DECT (DISPOSABLE) IMPLANT
SPONGE SURGIFOAM ABS GEL SZ50 (HEMOSTASIS) ×3 IMPLANT
STAPLER VISISTAT 35W (STAPLE) IMPLANT
SUT PROLENE 4 0 RB 1 (SUTURE) ×8
SUT PROLENE 4-0 RB1 .5 CRCL 36 (SUTURE) ×4 IMPLANT
SUT PROLENE 5 0 CC1 (SUTURE) IMPLANT
SUT PROLENE 6 0 C 1 30 (SUTURE) ×3 IMPLANT
SUT PROLENE 6 0 CC (SUTURE) IMPLANT
SUT SILK 0 TIES 10X30 (SUTURE) ×3 IMPLANT
SUT SILK 2 0 TIES 10X30 (SUTURE) ×3 IMPLANT
SUT SILK 2 0SH CR/8 30 (SUTURE) IMPLANT
SUT SILK 3 0 TIES 10X30 (SUTURE) ×3 IMPLANT
SUT SILK 3 0SH CR/8 30 (SUTURE) IMPLANT
SUT VIC AB 0 CT1 27 (SUTURE) ×2
SUT VIC AB 0 CT1 27XBRD ANBCTR (SUTURE) ×1 IMPLANT
SUT VIC AB 1 CT1 18XBRD ANBCTR (SUTURE) ×1 IMPLANT
SUT VIC AB 1 CT1 8-18 (SUTURE) ×2
SUT VIC AB 2-0 CTB1 (SUTURE) ×3 IMPLANT
SUT VIC AB 3-0 SH 27 (SUTURE) ×2
SUT VIC AB 3-0 SH 27X BRD (SUTURE) ×1 IMPLANT
SUT VIC AB 3-0 SH 8-18 (SUTURE) ×3 IMPLANT
SUT VICRYL 4-0 PS2 18IN ABS (SUTURE) ×3 IMPLANT
TOWEL OR 17X24 6PK STRL BLUE (TOWEL DISPOSABLE) ×6 IMPLANT
TOWEL OR 17X26 10 PK STRL BLUE (TOWEL DISPOSABLE) ×6 IMPLANT
TRAP SPECIMEN MUCOUS 40CC (MISCELLANEOUS) IMPLANT
WATER STERILE IRR 1000ML POUR (IV SOLUTION) ×3 IMPLANT

## 2015-06-04 NOTE — Op Note (Signed)
Date of surgery: 06/04/2015 Preoperative diagnosis: Spondylosis with radiculopathy L4-5 and L5-S1, lumbago. Postoperative diagnosis: Spondylosis with radiculopathy L4-5 L5-S1, lumbago. Procedure: Arthroplasty L4-5 and L5-S1 with Prodisc lumbar. Decompression of the disc space L4-5 and L5-S1. Surgeon: Kristeen Miss M.D. First assistant:Neelesh  Nunudkumar M.D. Approach, closure: Dr. Shanon Rosser M.D. Indications: Patient is a 55 year old individual who had a work-related injury back in 2015 she has had chronic back pain and right lumbar radicular pain since that time and despite efforts at conservative management only relief that she has experienced was after discography and interdiscal spheroid injections at L4-5 and L5-S1. The relief was significant nature however it was short-lived lasting only a month or so. It is noted that she has advanced degenerative changes at L4-5 and L5-S1 she has chronic right lumbar radiculopathy. She's been advised regarding disc arthroplasty at L4-5 and L5-S1 is now taken to the operating room for this procedure.  Procedure: Patient was brought to the operating room and placed on table in supine position. After the smooth induction of general endotracheal anesthesia she had an inflatable lumbar roll placed in fluoroscopic guidance was used to localize the L4-5 and L5-S1 space. The area was prepped with alcohol and DuraPrep and draped in a sterile fashion. Transverse incision was made on the left side of the abdomen by Dr. Doren Custard dissection was then carried through the superficial tissues to expose the retroperitoneum. L4-5 was isolated first and tractors were placed to expose the front of the disc space. Radiographic confirmation of the midline was then performed. When this was verified a discectomy was performed at L4-5 opening the anterior longitudinal ligament and removing a significant quantity of the severely degenerated and desiccated disc material as regional posterior  longitudinal ligament was reached ligament itself was not opened but dissection in the degenerated disc material was removed wholly out into the lateral gutters on either side endplates were prepared by removing all the cartilaginous material but not curettaging the endplates. Then the trial was used and is felt that ultimately a 10 mm large size arthroplasty with 6 of lordosis would fit best into this interspace. The device was then prepared heels were cut into the vertebrae at L4 and L5 and then the device was tamped into position the Blum was deployed into the middle of the device and the placement tool was then removed. Final radiographs were obtained at L4-L5 showed good central placement of the arthroplasty. Attention was then turned to L5-S1 which time Dr. Doren Custard did further release of the iliac vein on the left side to allow placement of retractors retracting the left iliac vein over towards the right side. Similar procedure was then carried out opening the anterior longitudinal ligament and removing all the degenerated disc material. At L5-S1 the disc was even further degenerated and was at L4-L5 here there was a breach in the posterior longitudinal ligament centrally where there was noted to be a small central disc herniation the disc was carefully removed from the space. The dissection was carried out laterally to expose and decompress the extraforaminal zones. As this was achieved the endplates were noted to be somewhat more sclerotic on right side than on the left side they were even with a respiratory then the trial was placed to select the appropriate size here was felt that a large implant 10 mm in height with 11 of lordosis would fit best. After this was sized correctly keels were cut into the vertebrae at L5 and S1. The implant was then placed  and tamped into position. The Polly was then deployed in a similar fashion. The applicator was then removed and final radiographs were obtained after  removing all the retractors. The placement was good in AP and lateral projections. The procedure was then turned over to Dr. Doren Custard for final closure. Blood loss for this portion of procedure was estimated approximate 450 mL.

## 2015-06-04 NOTE — Transfer of Care (Signed)
Immediate Anesthesia Transfer of Care Note  Patient: Ashley Hurst  Procedure(s) Performed: Procedure(s) with comments: L4-5 L5-S1 Lumbar artificial disc replacement with Dr. Scot Dock for approach (N/A) - L4-5 L5-S1 Lumbar artificial disc replacement with Dr. Scot Dock for approach ABDOMINAL EXPOSURE (N/A) - ABDOMINAL EXPOSURE  Patient Location: PACU  Anesthesia Type:General  Level of Consciousness: awake, oriented and patient cooperative  Airway & Oxygen Therapy: Patient Spontanous Breathing and Patient connected to face mask oxygen  Post-op Assessment: Report given to RN and Post -op Vital signs reviewed and stable  Post vital signs: Reviewed  Last Vitals:  Filed Vitals:   06/04/15 0639  BP: 128/76  Pulse: 75  Temp: 36.4 C  Resp: 18    Complications: No apparent anesthesia complications

## 2015-06-04 NOTE — Anesthesia Procedure Notes (Signed)
Procedure Name: Intubation Date/Time: 06/04/2015 7:59 AM Performed by: Jenne Campus Pre-anesthesia Checklist: Patient identified, Emergency Drugs available, Suction available, Patient being monitored and Timeout performed Patient Re-evaluated:Patient Re-evaluated prior to inductionOxygen Delivery Method: Circle system utilized Preoxygenation: Pre-oxygenation with 100% oxygen Intubation Type: IV induction Ventilation: Mask ventilation without difficulty Laryngoscope Size: Miller and 2 Grade View: Grade I Tube type: Oral Tube size: 7.0 mm Number of attempts: 1 Airway Equipment and Method: Stylet Placement Confirmation: ETT inserted through vocal cords under direct vision,  positive ETCO2,  CO2 detector and breath sounds checked- equal and bilateral Secured at: 22 cm Tube secured with: Tape Dental Injury: Teeth and Oropharynx as per pre-operative assessment

## 2015-06-04 NOTE — Anesthesia Postprocedure Evaluation (Signed)
Anesthesia Post Note  Patient: Ashley Hurst  Procedure(s) Performed: Procedure(s) (LRB): L4-5 L5-S1 Lumbar artificial disc replacement with Dr. Scot Dock for approach (N/A) ABDOMINAL EXPOSURE (N/A)  Patient location during evaluation: PACU Anesthesia Type: General Level of consciousness: awake and alert Pain management: pain level controlled Vital Signs Assessment: post-procedure vital signs reviewed and stable Respiratory status: spontaneous breathing, nonlabored ventilation, respiratory function stable and patient connected to nasal cannula oxygen Cardiovascular status: blood pressure returned to baseline and stable Postop Assessment: no signs of nausea or vomiting Anesthetic complications: no    Last Vitals:  Filed Vitals:   06/04/15 1400 06/04/15 1407  BP:  121/82  Pulse: 90 90  Temp:  36.6 C  Resp: 13 17    Last Pain:  Filed Vitals:   06/04/15 1408  PainSc: Asleep                 Zenaida Deed

## 2015-06-04 NOTE — Progress Notes (Signed)
Orthopedic Tech Progress Note Patient Details:  Ashley Hurst [birth year] [id number] Brace order completed by bio-tech vendor. Patient ID: Ashley Hurst, female   DOB: [birth year], 55 y.o.   MRN: [id number]   Braulio Bosch 06/04/2015, 4:55 PM

## 2015-06-04 NOTE — Progress Notes (Signed)
Orthopedic Tech Progress Note Patient Details:  Ashley Hurst 09/04/1960 [id number]  Patient ID: [name], female   DOB: December 24, 1960, 55 y.o.   MRN: [id number] Called in bio-tech brace order; spoke with Dolores Lory, Kimila Papaleo 06/04/2015, 2:49 PM

## 2015-06-04 NOTE — Progress Notes (Signed)
Utilization review completed.  

## 2015-06-04 NOTE — Significant Event (Signed)
Rapid Response Event Note  Overview: Time Called: [name] Time: 1831 Event Type: Hypotension  Initial Focused Assessment:  Called by RN for patient with hypotension, SBP 80's when trying to get patient up to walk.  Upon  My arrival to patients room, Patient in bed with nasal cannula 2 LPm.  Patient states she became dizzy, diaphoretic, nausea, and weak.  Patient appeared pale and diaphoretic. BP checked 84/49, HR 81, 95%.     Interventions:  Started a NS bolus as per protocol.  RN paged MD and ordered 1000cc NS bolus.  BP rechecked 88/45, HR 84, rechecked after 250 cc bolus given 88/45, HR 84, rechecked again after another 100 cc 94/49, HR 82.  Patient states she is feeling better, color has returned.   Event Summary:  Updated Rn, informed RN that RRT will follow up.  Rn to call if assistance needed   at      at          [facility], Harlin Rain

## 2015-06-04 NOTE — Progress Notes (Signed)
Monitored patient's BP closely. BP still low but patient is asymtomatic. No attempts done to get her up since the last syncopal episode. Surgical site on LLQ noted some swelling but abdomen nondistended. On call Dr Kathyrn Sheriff paged and made aware about patients condition. New orders received: CBC STAT tonite, 1 L NS bolus, and with instruction to not get up patient tonight. RR Nurse updated abt patient's condition and about MD on call new orders. Will cont to monitor closely.   06/04/15 2126 06/04/15 2128  Vitals  BP (!) 87/43 mmHg (!) 90/47 mmHg  MAP (mmHg) 53 52  BP Location Right Arm Left Arm  BP Method Automatic Automatic  Patient Position (if appropriate) Lying Sitting  Pulse Rate 91 91  Pulse Rate Source Monitor Monitor

## 2015-06-04 NOTE — Progress Notes (Signed)
   VASCULAR SURGERY ASSESSMENT & PLAN:  * Stable post op.  SUBJECTIVE: Pain adequately controlled.   PHYSICAL EXAM: Filed Vitals:   06/04/15 1321 06/04/15 1324 06/04/15 1330 06/04/15 1345  BP: 140/85     Pulse: 87 88 88 91  Temp:      TempSrc:      Resp: 16 14 14 15   SpO2: 95% 96% 94% 94%   Palpable pedal pulses.   LABS: Lab Results  Component Value Date   WBC 5.6 05/23/2015   HGB 10.8* 05/23/2015   HCT 34.9* 05/23/2015   MCV 94.1 05/23/2015   PLT 205 05/23/2015   Lab Results  Component Value Date   CREATININE 0.64 05/23/2015   Active Problems:   Lumbar radiculopathy  Gae Gallop Beeper: [id number] 06/04/2015

## 2015-06-04 NOTE — Interval H&P Note (Signed)
History and Physical Interval Note:  06/04/2015 7:13 AM  Marrion Coy Headings  has presented today for surgery, with the diagnosis of Lumbar radiculopathy  The various methods of treatment have been discussed with the patient and family. After consideration of risks, benefits and other options for treatment, the patient has consented to  Procedure(s) with comments: L4-5 L5-S1 Lumbar artificial disc replacement with Dr. Scot Dock for approach (N/A) - L4-5 L5-S1 Lumbar artificial disc replacement with Dr. Scot Dock for approach ABDOMINAL EXPOSURE (N/A) as a surgical intervention .  The patient's history has been reviewed, patient examined, no change in status, stable for surgery.  I have reviewed the patient's chart and labs.  Questions were answered to the patient's satisfaction.     [name]

## 2015-06-04 NOTE — Progress Notes (Signed)
Staff in room trying to get patient out of bed to ambulate, patient stated" I feel nauseous and a little weak" staff sat patient on the side of the bed  And placed a cold washcloth on her forehead. Patient stated she still feels weak and patient looked pale. Staff proceeded to place patient back to bed and vitals was taken: BP 79/45,P83. / BP 84/52, P 84, oxygen 95% on 2L [place]. Rapid response nurse was called and MD was also notified. Will give report to incoming nurse and continue to monitor.

## 2015-06-04 NOTE — H&P (Signed)
CHIEF COMPLAINT:                              Back and right lower extremity pain since work-related incident 03/12/2014.    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  She was jarred rather suddenly in her back and initially noted back pain that radiated into the right buttock and right leg.  She was subsequently seen and evaluated for this process a number of times, and ultimately has been seen and treated by Dr. Laroy Apple.    DATA:                                                  She has had an MRI of her lumbar spine, and this was performed in December of 2015 and demonstrates that she has a central direct herniation at the level of L4-5 that effaces the center portion of the canal but does not cause any overt nerve root compromise.  Her lumbar spine also demonstrates that she has a moderate amount of degenerative change at L5-S1 but no evidence of any lateral recess stenosis or nerve root compromise.  The rest of her spine actually appears to be quite healthy save for the fact that she has some lumbar spine straightening, suggesting some muscle spasm.  Plain x-rays do not accompany the MRI.  Clinically, Ashley Hurst tells me that she has had pain radiating from the buttock into the right lower extremity and down into the medial aspect of the foot.  She notes that the foot itself is quite sore nearly all the time.  She feels that wearing her steel-toed shoes which is required at work makes her feel worse.  She also notes that she has to walk about on concrete.  Nonetheless, she notes that there is weakness in that right leg and foot that she has been feeling also during this entire time.  She has had a number of steroid injections by Dr. Ron Agee, and some of the injections have caused her to feel some of the discomfort into that right leg.   However, they have only given her modest relief of this process.    PAST MEDICAL HISTORY:                    Reveals that she had some hypertension.  Her other medical history is fairly negative.  She has not had any surgeries in the past other than an ACL replacement on the left side in 1993.  SOCIAL HISTORY:                                She does not smoke.  She drinks alcohol socially.    MEDICATIONS:  I note that she has been using alprazolam, citalopram, estradiol, fluticasone, levothyroxine, meloxicam, metaxalone, zolpidem, and oxycodone with Tylenol.  There is a note on her Medication Sheet, and she has recently been started on some gabapentin.    PHYSICAL EXAMINATION:                    She has had some weight gain over the past six months since the incident because of her inability to do significant activities and currently is at 143 pounds and 5 feet 4-1/2 inches.  On her physical exam, I note that she has good preservation of the function of the distal right lower extremity, particularly the tibialis anterior group and extensor hallucis longus.  She does have some swelling of the medial aspect of the foot and tenderness in and about the ankle.  Her Achilles reflex, however, is intact as is her patellar reflex on the right side.     Ashley Hurst provocative discography.  She notes that the discogram indeed was painful, reproducing substantial pain for her, and this was despite her having some substantial amount of sedation on board.  She does note that afterwards the pain has improved.  Nonetheless, it is still a component of pain that is present.  She has been back at work, working her light-duty shifts.  She notes that the affect of the injection in terms of the degree of pain relief has been somewhat limited, not unlike her previous injections.  Nonetheless, the discography was positive at the L4-5 level.           DATA:                                                  I   reviewed her MRIs with her.  I note that she has disc degenerative changes at L4-5 and L5-S1.  L4-5 has a significant broad-based bulge posteriorly that causes some lateral recessed stenosis.  There is some modest facet arthropathy, but not very severe in Ashley Hurst's case.                     IMPRESSION/PLAN:                             I suggested that she may in fact be a good candidate for an arthroplasty of the discs at L4-5 and L5-S1.  I discussed this and demonstrated on a model the nature of a disc arthroplasty.  Ashley Hurst would like to have something definitive done to help her situation.  She notes that the pain is substantial and she is willing to undergo a procedure if it can help to reduce the degree of pain and improve her functionality.  I noted to her the two options would be to consider surgical arthrodesis of the joints involved, L4-5 and L5-S1, or arthroplasty, which in her case I believe would be the better choice given the fact that she does not have significant arthropathy in the facet joints.  After some discussion, we plan to proceed with arthroplasty at L4-5 and L5-S1. Marland Kitchen  She has been through an extensive effort at conservative management for this process.  I am not certain that any other conservative efforts are going to yield a better result for her.

## 2015-06-04 NOTE — Progress Notes (Signed)
Patient ID: Ashley Hurst, female   DOB: 01/14/1961, 55 y.o.   MRN: [id number] Vital signs are stable Motor function appears intact Abdomen is sore and back is also sore Patient will be ambulated this evening

## 2015-06-04 NOTE — H&P (View-Only) (Signed)
[facility] [place]  Patient name: Ashley Hurst MRN: [id number] DOB: March 07, 1961 Sex: female  REASON FOR CONSULT: Evaluate for anterior retroperitoneal approach to L4-L5 and L5-S1. Referred by Dr. Kristeen Miss.  HPI: Ashley Hurst is a 55 y.o. female, who injured her back at work on 03/12/2014. She has failed conservative treatment and is now being considered for anterior lumbar interbody fusion at the L4-L5 and L5-S1 level. She has significant low back pain which is fairly constant and is aggravated by activity and relieved somewhat with rest. Her back pain has significantly limited her ability to work.  I have reviewed the records from Dr. Clarice Pole office. She has disc degenerative changes at L4-L5 and L5-S1.  She's had a previous umbilical hernia repair.  Her risk factors for peripheral vascular disease include hypertension and hyperlipidemia. She denies any history of diabetes, family history of premature cardiovascular disease, or tobacco use.  Past Medical History  Diagnosis Date  . Hypertension   . Hyperlipidemia     No family history on file.  There is no family history of premature cardiovascular disease.  SOCIAL HISTORY: Social History   Social History  . Marital Status: Single    Spouse Name: N/A  . Number of Children: N/A  . Years of Education: N/A   Occupational History  . Not on file.   Social History Main Topics  . Smoking status: Not on file  . Smokeless tobacco: Not on file  . Alcohol Use: Not on file  . Drug Use: Not on file  . Sexual Activity: Not on file   Other Topics Concern  . Not on file   Social History Narrative    Not on File  Current Outpatient Prescriptions  Medication Sig Dispense Refill  . ALPRAZolam (XANAX) 1 MG tablet Take 0.5 mg by mouth 2 (two) times daily. No frequency of medications is noted in list provided by Dr Landis Gandy  [facility]    . aspirin EC 81 MG tablet Take 81 mg by  mouth every morning.    . busPIRone (BUSPAR) 10 MG tablet Take 10 mg by mouth 2 (two) times daily.    . citalopram (CELEXA) 20 MG tablet Take 20 mg by mouth every morning.  3  . cyanocobalamin 1000 MCG tablet Take 1,000 mcg by mouth daily.    Marland Kitchen estradiol (ESTRACE) 1 MG tablet Take 1 mg by mouth daily.     . ferrous sulfate 325 (65 FE) MG tablet Take 325 mg by mouth daily.  1  . fluticasone (FLONASE) 50 MCG/ACT nasal spray Place 1 spray into both nostrils daily.     Marland Kitchen levothyroxine (SYNTHROID, LEVOTHROID) 50 MCG tablet Take 50 mcg by mouth daily before breakfast.    . lisinopril-hydrochlorothiazide (PRINZIDE,ZESTORETIC) 20-25 MG tablet Take 1 tablet by mouth daily.     . meloxicam (MOBIC) 15 MG tablet Take 15 mg by mouth daily.     . Multiple Vitamins-Minerals (COMPLETE MULTIVITAMIN/MINERAL PO) Take 1 tablet by mouth every morning.    . Omega-3 Fatty Acids (FISH OIL) 600 MG CAPS Take 600 mg by mouth daily.    . traMADol (ULTRAM) 50 MG tablet Take by mouth every 6 (six) hours as needed.  3  . zolpidem (AMBIEN) 10 MG tablet Take 10 mg by mouth at bedtime.      No current facility-administered medications for this visit.    REVIEW OF SYSTEMS:  [X]  denotes positive finding, [ ]  denotes negative  finding Cardiac  Comments:  Chest pain or chest pressure:    Shortness of breath upon exertion:    Short of breath when lying flat:    Irregular heart rhythm:        Vascular    Pain in calf, thigh, or hip brought on by ambulation:    Pain in feet at night that wakes you up from your sleep:     Blood clot in your veins:    Leg swelling:  X       Pulmonary    Oxygen at home:    Productive cough:     Wheezing:         Neurologic    Sudden weakness in arms or legs:     Sudden numbness in arms or legs:     Sudden onset of difficulty speaking or slurred speech:    Temporary loss of vision in one eye:     Problems with dizziness:         Gastrointestinal    Blood in stool:     Vomited blood:          Genitourinary    Burning when urinating:     Blood in urine:        Psychiatric    Major depression:         Hematologic    Bleeding problems:    Problems with blood clotting too easily:        Skin    Rashes or ulcers:        Constitutional    Fever or chills:      PHYSICAL EXAM: There were no vitals filed for this visit.  GENERAL: The patient is a well-nourished female, in no acute distress. The vital signs are documented above. CARDIAC: There is a regular rate and rhythm.  VASCULAR: I do not detect carotid bruits. He has palpable femoral, popliteal, and pedal pulses bilaterally. PULMONARY: There is good air exchange bilaterally without wheezing or rales. ABDOMEN: Soft and non-tender with normal pitched bowel sounds.  MUSCULOSKELETAL: There are no major deformities or cyanosis. NEUROLOGIC: No focal weakness or paresthesias are detected. SKIN: There are no ulcers or rashes noted. PSYCHIATRIC: The patient has a normal affect.  DATA:  I have reviewed her x-rays which she brought with her which show degenerative changes at L4-L5 and L5-S1.  MEDICAL ISSUES:  DEGENERATIVE DISC DISEASE L4-L5 AND L5-S1: The patient appears to be a reasonable candidate for anterior retroperitoneal exposure for ALIF. I have reviewed our role in exposure of the spine in order to allow anterior lumbar interbody fusion at the appropriate levels. We have discussed the potential complications of surgery, including but not limited to, arterial or venous injury, thrombosis, or bleeding. We have also discussed the potential risks of wound healing problems, the development of a hernia, nerve injury, leg swelling, or other unpredictable medical problems. All the patient's questions were answered and they are agreeable to proceed. Her surgery is scheduled for 06/04/2015.  [name] [facility] [place]: [phone]

## 2015-06-04 NOTE — Op Note (Signed)
NAME: Ashley Hurst   MRN: [id number] DOB: May 11, 1961    DATE OF OPERATION: 06/04/2015  PREOP DIAGNOSIS: Degenerative disc disease L4-L5 and L5-S1  POSTOP DIAGNOSIS: Same  PROCEDURE: Anterior retroperitoneal exposure of L4-L5 and L5-S1  EXPOSURE SURGEON: Judeth Cornfield. Scot Dock, MD, FACS  SPINE SURGEON: Kristeen Miss M.D.  ANESTHESIA: Gen.   EBL: Per anesthesia record  INDICATIONS: Ashley Hurst is a 55 y.o. female who presented with significant degenerative disc disease at L4-L5 and L5-S1 I was asked to provide anterior rector perineal exposure of L4-L5 and L5-S1.  FINDINGS: L5-S1 was located directly behind the distal inferior vena cava and left common iliac vein.  TECHNIQUE: The patient was taken to the operating room and received a general anesthetic. Pulse oximeter had been placed in the left great toe. Monitoring lines had been placed by anesthesia. The level of the L4-L5 disc and L5-S1 disc were marked by Dr. Kristeen Miss. The abdomen was prepped and draped in usual sterile fashion. A transverse incision was made to the left of the midline at the proposed marked level to allow exposure of both L4-L5 and L5-S1. The dissection was carried down to the anterior rectus sheath. The anterior rectus sheath was divided transversely. Medially, the incision was extended over to the right side exposing some of the right rectus abdominis muscle. Laterally, the fascial incision extended pass the linea semilunaris.  The anterior rectus sheath was mobilized superiorly and inferiorly and then the medial aspect of the left rectus abdominis muscle fully mobilized. The rectus abdominis muscle was retracted laterally. The rectoperineal space was entered and dissection carried down to the psoas. The iliac artery was dissected free bluntly proximally and distally. This allowed exposure of the left common iliac vein which was directly over the L5-S1 disc. The L5-S1 space was higher than normal and I felt  that it would be very difficult to obtain exposure of the L5-S1 disc from below the iliac vein given the location of the bifurcation of the inferior vena cava.   The dissection was then carried proximally. A segmental lumbar artery and vein were clipped and divided. There was some bleeding from the anterior aspect of the proximal left common iliac vein which was controlled with pressure. We did place a needle in the L5-S1 disc to confirm where we were. We therefore elected to address the L4-L5 disc space first. The left common iliac artery and iliac vein were retracted to the patient's right allowing exposure of the L4-L5 disc. The Thompson retractor system was set up. A reverse lift retractor was placed on the right side of the L4-L5 disc. Retractor was then placed on the left side and then also superiorly and inferiorly allowing adequate exposure of the L4-L5 disc.  At this point, Dr. Kristeen Miss performed the anterior lumbar interbody fusion. Once this was completed I return to the operating room for exposure of the L5-S1 disc. The retractors were removed and initially a reverse lip retractor was placed on the right side of the L5-S1 disc space. There was some bleeding from the iliac vein below and this was easily controlled with a retractor to keep gentle pressure on this. A retractor was placed on the left side of the disc and also superiorly in order to allow exposure of the L5-S1 disc. Dr. Mallie Mussel also performed anterior lumbar interbody fusion. At the completion the retractors were removed and it was good hemostasis.  The anterior  Rectus sheath was closed with a running #1  Vicryl suture.  The subcutaneous layer was closed with a deep layer of 2-0 Vicryl and a subsequent layer of 3-0 Vicryl. The skin was closed with a 4-0 Vicryl. Liquid barium was applied. The patient tolerated the procedure well and was transferred to the recovery room in stable condition. All needle and sponge counts were  correct.  [name], MD, FACS [facility]  DATE OF DICTATION:   06/04/2015

## 2015-06-04 NOTE — Anesthesia Preprocedure Evaluation (Addendum)
Anesthesia Evaluation  Patient identified by MRN, date of birth, ID band Patient awake    Reviewed: Allergy & Precautions, H&P , NPO status , Patient's Chart, lab work & pertinent test results  History of Anesthesia Complications (+) PONVNegative for: history of anesthetic complications  Airway Mallampati: III  TM Distance: <3 FB Neck ROM: full   Comment: Small mouth opening Dental no notable dental hx. (+) Teeth Intact, Dental Advisory Given   Pulmonary neg pulmonary ROS,    Pulmonary exam normal breath sounds clear to auscultation       Cardiovascular hypertension, Pt. on medications Normal cardiovascular exam Rhythm:regular Rate:Normal  Stress test performed 05/31/15 and deemed low risk with preserved EF 70% (hyperdynamic even), no concern for ishemia   Neuro/Psych PSYCHIATRIC DISORDERS Anxiety Depression negative neurological ROS     GI/Hepatic negative GI ROS, Neg liver ROS,   Endo/Other  negative endocrine ROSHypothyroidism   Renal/GU negative Renal ROS     Musculoskeletal   Abdominal   Peds  Hematology negative hematology ROS (+)   Anesthesia Other Findings   Reproductive/Obstetrics negative OB ROS                           Anesthesia Physical Anesthesia Plan  ASA: II  Anesthesia Plan: General   Post-op Pain Management:    Induction: Intravenous  Airway Management Planned: Oral ETT  Additional Equipment: Arterial line  Intra-op Plan:   Post-operative Plan: Extubation in OR  Informed Consent: I have reviewed the patients History and Physical, chart, labs and discussed the procedure including the risks, benefits and alternatives for the proposed anesthesia with the patient or authorized representative who has indicated his/her understanding and acceptance.   Dental Advisory Given  Plan Discussed with: Anesthesiologist and CRNA  Anesthesia Plan Comments: (Plan GA with  ETT, 2 PIVs)       Anesthesia Quick Evaluation

## 2015-06-05 ENCOUNTER — Encounter (HOSPITAL_COMMUNITY): Payer: Self-pay | Admitting: Neurological Surgery

## 2015-06-05 LAB — BASIC METABOLIC PANEL
Anion gap: 6 (ref 5–15)
CO2: 24 mmol/L (ref 22–32)
CREATININE: 0.67 mg/dL (ref 0.44–1.00)
Calcium: 7.1 mg/dL — ABNORMAL LOW (ref 8.9–10.3)
Chloride: 107 mmol/L (ref 101–111)
GFR calc Af Amer: >60 mL/min (ref >60)
GLUCOSE: 135 mg/dL — AB (ref 65–99)
POTASSIUM: 4.1 mmol/L (ref 3.5–5.1)
SODIUM: 137 mmol/L (ref 135–145)

## 2015-06-05 LAB — CBC
HCT: 23.1 % — ABNORMAL LOW (ref 36.0–46.0)
Hemoglobin: 7.2 g/dL — ABNORMAL LOW (ref 12.0–15.0)
MCH: 28.8 pg (ref 26.0–34.0)
MCHC: 31.2 g/dL (ref 30.0–36.0)
MCV: 92.4 fL (ref 78.0–100.0)
PLATELETS: 133 10*3/uL — AB (ref 150–400)
RBC: 2.5 MIL/uL — AB (ref 3.87–5.11)
RDW: 14.8 % (ref 11.5–15.5)
WBC: 9.3 10*3/uL (ref 4.0–10.5)

## 2015-06-05 LAB — PREPARE RBC (CROSSMATCH)

## 2015-06-05 MED ORDER — SODIUM CHLORIDE 0.9 % IV SOLN
Freq: Once | INTRAVENOUS | Status: AC
  Administered 2015-06-05: 1000 mL via INTRAVENOUS

## 2015-06-05 NOTE — Progress Notes (Signed)
Patient ID: Ashley Hurst, female   DOB: 12-30-1960, 55 y.o.   MRN: [id number] June has had some relative hypotension with a blood pressure into the 70s. Her postoperative hemoglobin is 7.2. She has not had any significant bouts of tachycardia however she has been extremely lightheaded dizzy and weak when attempting to get up. I suspect this is due to some blood loss from the time of surgery.we'll plan transfusion of 2 units of packed cells.

## 2015-06-05 NOTE — Progress Notes (Signed)
Attempted to get patient OOB and walk in hallway as per order this morning. Patient able to walk with walker assist up to the door then started seeing white spots as she described, started feeling sweaty/dizzy and nauseated and feeling that she's going to faint. Also noted patient hyperventilating at that time. Put patient back to bed x 2 person assist, BP rechecked 89/47 HR 81 O2 sat 100% at RA. Updated RRN about the episode. Will cont to monitor pt closely.

## 2015-06-05 NOTE — Progress Notes (Signed)
   VASCULAR SURGERY ASSESSMENT & PLAN:  * 1 Day Post-Op s/p: Anterior retroperitoneal exposure of L5-S1 and L4-L5  *  Acute blood loss anemia. Hemoglobin is 7.2. She had some problems with dizziness when she was up yesterday and her blood pressure has been "soft." I would recommend transfusing 2 units if this is agreeable with Dr. Ellene Route.  SUBJECTIVE: Pain seems fairly well controlled.  PHYSICAL EXAM: Filed Vitals:   06/05/15 0012 06/05/15 0401 06/05/15 0551 06/05/15 0629  BP: 99/52 98/46 86/53  89/46  Pulse: 87 86 81 81  Temp:  98.9 F (37.2 C)    TempSrc:  Oral    Resp:  18  20  SpO2:  100%  100%   Her dressing is dry. She has palpable pedal pulses.  LABS: Lab Results  Component Value Date   WBC 9.3 06/05/2015   HGB 7.2* 06/05/2015   HCT 23.1* 06/05/2015   MCV 92.4 06/05/2015   PLT 133* 06/05/2015   Lab Results  Component Value Date   CREATININE 0.67 06/05/2015   Active Problems:   Lumbar radiculopathy  Ashley Hurst Beeper: [id number] 06/05/2015

## 2015-06-05 NOTE — Progress Notes (Signed)
Given updates about patient's status to on call MD. Hgb this am 7.2 from 7.0 last night. Patient's abdomen slightly more distended but not rigid. Surgical site with noted redness around incision site. Rapid Response Nurse followed up and updated about patient's condition. Received order to try to get patient up this morning. Will continue to monitor patient closely.   06/05/15 0401 06/05/15 0551  Vitals  Temp 98.9 F (37.2 C) --   Temp Source Oral --   BP (!) 98/46 mmHg (!) 86/53 mmHg  BP Location Right Arm Left Arm  BP Method Automatic Automatic  Patient Position (if appropriate) Lying Lying  Pulse Rate 86 81  Pulse Rate Source Monitor Monitor  Resp 18 --   Oxygen Therapy  SpO2 100 % --   O2 Device Nasal Cannula --   O2 Flow Rate (L/min) 2 L/min --

## 2015-06-05 NOTE — Progress Notes (Signed)
Pt seen last night at 2130 and again at 0515 this morning. Updated several times over phone by bedside RN regarding Pt BP and lab results. RN advised to monitor Pt closely and update MD as well on Pt status. This AM Pt HGB 7.2, improved from post op Hgb 7.0 last night. RN concerned about ABD distention. Pt seen this AM. ABD slightly distended, soft, redness noted at incision site. Per Patient her ABD feels "less tight" after relieving gas in bedpan. HOB lowered for turning and Pt repositioned in bed.  Pt complains of feeling light headed. Symptoms resolved after resting in bed for a few moments and bringing HOB up. VS checked yielding 86/58 MAP 66, HR 80s Po2 94-96% on RA. RN to update MD on this morning hgb and Pt current VS as well as ABD distention. RN advised to monitor closely and update myself and Provider on changes. Pt to remain on RRT follow up list for day shift RR RN.

## 2015-06-05 NOTE — Evaluation (Signed)
Physical Therapy Evaluation Patient Details Name: Ashley Hurst MRN: [id number] DOB: July 25, 1960 Today's Date: 06/05/2015   History of Present Illness  Pt is a 55 y/o female who presents s/p arthroplasty of L4-L5 and L5-S1 on 06/04/15. PMH significant for HTN, anxiety and depression.   Clinical Impression  This patient presents with acute pain and decreased functional independence following the above mentioned procedure. At the time of PT eval, pt was able to perform transfers with +2 assist mainly due to orthostatic hypotension. Pt was able to transition to the chair with RW and min assist, but was not able to tolerate further gait training. This patient is appropriate for skilled PT interventions to address functional limitations, improve safety and independence with functional mobility, and return to PLOF.  Orthostatics: Supine: 88/47     Sitting EOB: 113/58     Standing EOB: 90/52    Follow Up Recommendations Home health PT    Equipment Recommendations  Rolling walker with 5" wheels;3in1 (PT)    Recommendations for Other Services       Precautions / Restrictions Precautions Precautions: Fall;Back Precaution Booklet Issued: Yes (comment) Precaution Comments: Handout reviewed and pt was cued for back precautions during functional mobility.  Required Braces or Orthoses: Spinal Brace Spinal Brace: Lumbar corset;Applied in sitting position Restrictions Weight Bearing Restrictions: No      Mobility  Bed Mobility Overal bed mobility: Needs Assistance Bed Mobility: Rolling;Sidelying to Sit Rolling: Min assist Sidelying to sit: Min assist;+2 for physical assistance       General bed mobility comments: Assist to complete roll with bed pad, and for trunk elevation to full sitting position. Bed pad used to assist with scooting all the way out to EOB.   Transfers Overall transfer level: Needs assistance Equipment used: Rolling walker (2 wheeled) Transfers: Sit to/from [employer] Sit to Stand: Min assist;+2 physical assistance Stand pivot transfers: Min assist       General transfer comment: Assist with therapist on each side to power-up to full standing position. Pt was able to take pivotal steps around to the recliner chair with assist for walker advancement and cues for sequencing.   Ambulation/Gait             General Gait Details: Unable to advance to gait training this session due to orthostatics  Stairs            Wheelchair Mobility    Modified Rankin (Stroke Patients Only)       Balance Overall balance assessment: Needs assistance Sitting-balance support: Feet supported;No upper extremity supported Sitting balance-Leahy Scale: Fair Sitting balance - Comments: Pt able to sit EOB unsupported after brace was donned.    Standing balance support: Single extremity supported Standing balance-Leahy Scale: Poor Standing balance comment: Pt able to stand statically EOB with 1 hand on the walker for support while BP took.                              Pertinent Vitals/Pain Pain Assessment: 0-10 Pain Score: 8  Pain Location: End of session in back Pain Descriptors / Indicators: Operative site guarding;Discomfort Pain Intervention(s): Limited activity within patient's tolerance;Monitored during session;Repositioned    Home Living Family/patient expects to be discharged to:: Private residence Living Arrangements: Alone Available Help at Discharge: Family;Available 24 hours/day Type of Home: House Home Access: Stairs to enter   [employer] of Steps: 7 Home Layout: One level Home Equipment: None  Prior Function Level of Independence: Independent               Hand Dominance        Extremity/Trunk Assessment   Upper Extremity Assessment: Defer to OT evaluation           Lower Extremity Assessment: Generalized weakness      Cervical / Trunk Assessment: Normal;Other  exceptions  Communication   Communication: No difficulties  Cognition Arousal/Alertness: Awake/alert Behavior During Therapy: WFL for tasks assessed/performed Overall Cognitive Status: Within Functional Limits for tasks assessed                      General Comments      Exercises        Assessment/Plan    PT Assessment Patient needs continued PT services  PT Diagnosis Difficulty walking;Acute pain   PT Problem List Decreased strength;Decreased range of motion;Decreased activity tolerance;Decreased balance;Decreased mobility;Decreased knowledge of use of DME;Decreased safety awareness;Decreased knowledge of precautions;Pain  PT Treatment Interventions DME instruction;Stair training;Gait training;Functional mobility training;Therapeutic activities;Therapeutic exercise;Neuromuscular re-education;Patient/family education   PT Goals (Current goals can be found in the Care Plan section) Acute Rehab PT Goals Patient Stated Goal: Feel better PT Goal Formulation: With patient Time For Goal Achievement: 06/12/15 Potential to Achieve Goals: Good    Frequency Min 5X/week   Barriers to discharge        Co-evaluation PT/OT/SLP Co-Evaluation/Treatment: Yes Reason for Co-Treatment: For patient/therapist safety PT goals addressed during session: Mobility/safety with mobility;Balance;Proper use of DME         End of Session Equipment Utilized During Treatment: Back brace Activity Tolerance: Patient limited by pain;Treatment limited secondary to medical complications (Comment) (orthostatic hypotension) Patient left: in chair;with call bell/phone within reach Nurse Communication: Mobility status;Patient requests pain meds;Other (comment) (Positioning in chair, timing of next position change, BP)         Time: [id number] PT Time Calculation (min) (ACUTE ONLY): 42 min   Charges:   PT Evaluation $PT Eval Moderate Complexity: 1 Procedure PT Treatments $Therapeutic  Activity: 8-22 mins   PT G Codes:        Rolinda Roan 2015-06-18, 9:10 AM   Rolinda Roan, PT, DPT Acute Rehabilitation Services Pager: [phone]

## 2015-06-05 NOTE — Evaluation (Signed)
Occupational Therapy Evaluation Patient Details Name: Ashley Hurst MRN: [id number] DOB: 1961-01-28 Today's Date: 06/05/2015    History of Present Illness Pt is a 55 y/o female who presents s/p arthroplasty of L4-L5 and L5-S1 on 06/04/15. PMH significant for HTN, anxiety and depression.    Clinical Impression   Patient is s/p L4-5 L5-s1 fusion surgery resulting in functional limitations due to the deficits listed below (see OT problem list). PTA independent with adls Patient will benefit from skilled OT acutely to increase independence and safety with ADLS to allow discharge [place].  Orthostatic BPs  Supine 88/47  Sitting 113/58  Transfer to chair   BP once in chair sitting feet elevated due to symptoms 90/52             Follow Up Recommendations  Home health OT    Equipment Recommendations  3 in 1 bedside comode    Recommendations for Other Services       Precautions / Restrictions Precautions Precautions: Fall;Back Precaution Booklet Issued: Yes (comment) Precaution Comments: Handout reviewed and pt with teach back to help with recall of precautions Required Braces or Orthoses: Spinal Brace Spinal Brace: Lumbar corset;Applied in sitting position Restrictions Weight Bearing Restrictions: No      Mobility Bed Mobility Overal bed mobility: Needs Assistance Bed Mobility: Rolling;Sidelying to Sit Rolling: Min assist Sidelying to sit: Min assist;+2 for physical assistance       General bed mobility comments: Assist to complete roll with bed pad, and for trunk elevation to full sitting position. Bed pad used to assist with scooting all the way out to EOB.   Transfers Overall transfer level: Needs assistance Equipment used: Rolling walker (2 wheeled) Transfers: Sit to/from Stand Sit to Stand: Min assist;+2 physical assistance Stand pivot transfers: Min assist       General transfer comment: Assist with therapist on each side to power-up to full standing  position. Pt was able to take pivotal steps around to the recliner chair with assist for walker advancement and cues for sequencing.  pt with c/o seeing spots with mobility.     Balance Overall balance assessment: Needs assistance Sitting-balance support: Feet supported;No upper extremity supported Sitting balance-Leahy Scale: Fair Sitting balance - Comments: Pt able to sit EOB unsupported after brace was donned.    Standing balance support: Bilateral upper extremity supported;During functional activity Standing balance-Leahy Scale: Poor Standing balance comment: Pt able to stand statically EOB with 1 hand on the walker for support while BP took.                             ADL Overall ADL's : Needs assistance/impaired Eating/Feeding: Set up;Sitting Eating/Feeding Details (indicate cue type and reason): able to drink from cup Grooming: Wash/dry face;Set up;Sitting Grooming Details (indicate cue type and reason): cool rag to face             Lower Body Dressing: Maximal assistance;Sit to/from stand Lower Body Dressing Details (indicate cue type and reason): pt orthostatic and unable to cross at this time Toilet Transfer: Minimal assistance (+2 for safety due to BP)             General ADL Comments: See vitals in chart. pt with limited session due to vitals and recall of precautions     Vision     Perception     Praxis      Pertinent Vitals/Pain Pain Assessment: 0-10 Pain Score: 8  Pain Location:  back with c/o gas pains in abdomen Pain Descriptors / Indicators: Operative site guarding Pain Intervention(s): Repositioned;Monitored during session     Hand Dominance Right   Extremity/Trunk Assessment Upper Extremity Assessment Upper Extremity Assessment: Overall WFL for tasks assessed   Lower Extremity Assessment Lower Extremity Assessment: Defer to PT evaluation   Cervical / Trunk Assessment Cervical / Trunk Assessment: Other exceptions Cervical /  Trunk Exceptions: s/p surg   Communication Communication Communication: No difficulties   Cognition Arousal/Alertness: Awake/alert Behavior During Therapy: WFL for tasks assessed/performed Overall Cognitive Status: Impaired/Different from baseline Area of Impairment:  (high executive deficits)               General Comments: poor recall of precautions and teach back so end of session about to recall information. pt with high executive deficits noted. Pt requesting to void bladder and poor recall of foley   General Comments       Exercises       Shoulder Instructions      Home Living Family/patient expects to be discharged to:: Private residence Living Arrangements: Alone Available Help at Discharge: Family;Available 24 hours/day Type of Home: House Home Access: Stairs to enter [employer] of Steps: 7   Home Layout: One level     Bathroom Shower/Tub: Teacher, early years/pre: Standard     Home Equipment: None          Prior Functioning/Environment Level of Independence: Independent             OT Diagnosis: Generalized weakness;Cognitive deficits;Acute pain   OT Problem List: Decreased strength;Decreased activity tolerance;Impaired balance (sitting and/or standing);Decreased safety awareness;Decreased knowledge of use of DME or AE;Decreased knowledge of precautions;Pain   OT Treatment/Interventions: Self-care/ADL training;Therapeutic exercise;DME and/or AE instruction;Therapeutic activities;Cognitive remediation/compensation;Patient/family education;Balance training    OT Goals(Current goals can be found in the care plan section) Acute Rehab OT Goals Patient Stated Goal: to go home before saturday because its going to snow OT Goal Formulation: With patient Time For Goal Achievement: 06/19/15 Potential to Achieve Goals: Good  OT Frequency: Min 2X/week   Barriers to D/C:    sister to (A) at home       Co-evaluation PT/OT/SLP  Co-Evaluation/Treatment: Yes Reason for Co-Treatment: For patient/therapist safety PT goals addressed during session: Mobility/safety with mobility;Balance;Proper use of DME OT goals addressed during session: ADL's and self-care      End of Session Equipment Utilized During Treatment: Back brace;Rolling walker Nurse Communication: Mobility status;Precautions  Activity Tolerance: Other (comment) (BP orthostatic) Patient left: in chair;with call bell/phone within reach   Time: 0800-0831 OT Time Calculation (min): 31 min Charges:  OT General Charges $OT Visit: 1 Procedure OT Evaluation $OT Eval Moderate Complexity: 1 Procedure G-Codes:    Parke Poisson B 06/27/15, 9:40 AM   Jeri Modena   OTR/L PagerIP:[phone] Office: [phone] .

## 2015-06-05 NOTE — Progress Notes (Signed)
Updated on call about result of CBC previously ordered- hgb-7.0. Patient still asymptomatic. Also updated on call about surgical site and slight abdominal distention with tenderness more on RLQ. No further orderd noted. Will f/u blood work-CBC,BMP as previously ordered. Will cont to monitor pt closely.   06/05/15 0012  Vitals  BP (!) 99/52 mmHg  MAP (mmHg) 62  BP Location Left Arm  BP Method Automatic  Patient Position (if appropriate) Lying  Pulse Rate 87  Pulse Rate Source Monitor

## 2015-06-06 LAB — TYPE AND SCREEN
ABO/RH(D): A NEG
ANTIBODY SCREEN: NEGATIVE
Unit division: 0
Unit division: 0

## 2015-06-06 MED ORDER — MAGNESIUM CITRATE PO SOLN
1.0000 | Freq: Once | ORAL | Status: AC
  Administered 2015-06-06: 1 via ORAL
  Filled 2015-06-06: qty 296

## 2015-06-06 NOTE — Progress Notes (Signed)
Pt offered and refused ambulation at this time. States she would rather PT/OT ambulate her. Will continue to monitor. Bobbye Charleston, RN

## 2015-06-06 NOTE — Progress Notes (Signed)
   VASCULAR SURGERY ASSESSMENT & PLAN:  * 2 Days Post-Op s/p: Anterior retroperitoneal exposure of L4-L5, L5-S1  *  The patient says she anticipates discharge tomorrow. She has done well with physical therapy.  SUBJECTIVE: no significant pain.  PHYSICAL EXAM: Filed Vitals:   06/05/15 2230 06/06/15 0104 06/06/15 0518 06/06/15 0927  BP: 112/66 105/61 113/66 119/71  Pulse: 85 84 84   Temp: 98.8 F (37.1 C) 98.4 F (36.9 C) 98.4 F (36.9 C) 97.5 F (36.4 C)  TempSrc: Oral Oral Oral Oral  Resp: 17 16 16    SpO2: 95% 94% 96%    Incision looks fine. Palpable dorsalis pedis pulses.  Active Problems:   Lumbar radiculopathy  Gae Gallop Beeper: [id number] 06/06/2015

## 2015-06-06 NOTE — Care Management Note (Signed)
Case Management Note  Patient Details  Name: CURRIE BRUNGARD MRN: [id number] Date of Birth: 1961-01-15  Subjective/Objective:                    Action/Plan: CM spoke with Patty at [facility] [phone] regarding home health/DME needs.  PT/OT needs will be handled by Veto Kemps [phone]. DME needs will be handled by Ellan Lambert.  All requested clinicals have been faxed to [phone].  CM will continue to follow for any additional discharge needs. Expected Discharge Date:                  Expected Discharge Plan:  [place]  In-House Referral:     Discharge planning Services  CM Consult  Post Acute Care Choice:  Durable Medical Equipment, Home Health Choice offered to:  Patient (Arrangements to be made through Workers Comp)  DME Arranged:  3-N-1, Environmental consultant rolling DME Agency:   (workers comp)  HH Arranged:  PT, OT [place] Agency:   (workers comp)  Status of Service:  Completed, signed off  Medicare Important Message Given:    Date Medicare IM Given:    Medicare IM give by:    Date Additional Medicare IM Given:    Additional Medicare Important Message give by:     If discussed at [place] of Stay Meetings, dates discussed:    Additional CommentsRolm Baptise, RN 06/06/2015, 2:20 PM [phone]

## 2015-06-06 NOTE — Progress Notes (Signed)
Physical Therapy Treatment Patient Details Name: Ashley Hurst MRN: [id number] DOB: [birth year] Today's Date: 06/06/2015    History of Present Illness Pt is a 55 y/o female who presents s/p arthroplasty of L4-L5 and L5-S1 on 06/04/15. PMH significant for HTN, anxiety and depression.     PT Comments    Pt progressing towards physical therapy goals. Was able to progress gait training this session however reports LE numbness bilaterally down to feet. Stair training was deferred due to increased weight needed on walker for support. Will need to complete 7 stairs prior to d/c which is anticipated tomorrow. Will continue to follow.   Follow Up Recommendations  Outpatient PT;Supervision for mobility/OOB     Equipment Recommendations  Rolling walker with 5" wheels;3in1 (PT)    Recommendations for Other Services       Precautions / Restrictions Precautions Precautions: Fall;Back Precaution Booklet Issued: Yes (comment) Precaution Comments: Handout reviewed and pt with teach back to help with recall of precautions Required Braces or Orthoses: Spinal Brace Spinal Brace: Lumbar corset;Applied in sitting position Restrictions Weight Bearing Restrictions: No    Mobility  Bed Mobility               General bed mobility comments: Pt received in bathroom with RN present.   Transfers Overall transfer level: Needs assistance Equipment used: Rolling walker (2 wheeled) Transfers: Sit to/from Stand Sit to Stand: Supervision         General transfer comment: Pt was able to stand from raised toilet without assistance. Supervision for safety and VC's for maintenance of back precautions (twisting to flush toilet)  Ambulation/Gait Ambulation/Gait assistance: Min guard;Supervision Ambulation Distance (Feet): 200 Feet Assistive device: Rolling walker (2 wheeled) Gait Pattern/deviations: Step-through pattern;Decreased stride length;Trunk flexed Gait velocity: Decreased Gait velocity  interpretation: Below normal speed for age/gender General Gait Details: Initially min guard with quick progression to supervision for safety. Pt was cued for improved posture. Noted increased WB through UE's on walker. Pt states she feels her LE's are numb down to her feet and she was afraid to "rely" on them to hold her up. Stair training deferred this session due to LE numbness.    Stairs            Wheelchair Mobility    Modified Rankin (Stroke Patients Only)       Balance Overall balance assessment: Needs assistance Sitting-balance support: Feet supported;No upper extremity supported Sitting balance-Leahy Scale: Fair     Standing balance support: No upper extremity supported;During functional activity Standing balance-Leahy Scale: Fair Standing balance comment: Pt able to stand at sink to wash hands without UE support.                     Cognition Arousal/Alertness: Awake/alert Behavior During Therapy: WFL for tasks assessed/performed Overall Cognitive Status: Impaired/Different from baseline                      Exercises      General Comments        Pertinent Vitals/Pain Pain Assessment: Faces Faces Pain Scale: Hurts little more Pain Location: Back Pain Descriptors / Indicators: Operative site guarding;Aching;Numbness Pain Intervention(s): Limited activity within patient's tolerance;Monitored during session;Repositioned    Home Living                      Prior Function            PT Goals (current goals can now be found  in the care plan section) Acute Rehab PT Goals Patient Stated Goal: to go home before saturday because its going to snow PT Goal Formulation: With patient Time For Goal Achievement: 06/12/15 Potential to Achieve Goals: Good Progress towards PT goals: Progressing toward goals    Frequency  Min 5X/week    PT Plan Discharge plan needs to be updated    Co-evaluation             End of Session  Equipment Utilized During Treatment: Back brace Activity Tolerance: Patient tolerated treatment well Patient left: in chair;with call bell/phone within reach     Time: 0928-0949 PT Time Calculation (min) (ACUTE ONLY): 21 min  Charges:  $Gait Training: 8-22 mins                    G Codes:      Rolinda Roan 2015/06/17, 11:02 AM  Rolinda Roan, PT, DPT Acute Rehabilitation Services Pager: [phone]

## 2015-06-07 MED ORDER — OXYCODONE-ACETAMINOPHEN 5-325 MG PO TABS: 1.0000 | ORAL_TABLET | ORAL | Status: DC | PRN

## 2015-06-07 MED ORDER — DIAZEPAM 5 MG PO TABS: 5.0000 mg | ORAL_TABLET | Freq: Four times a day (QID) | ORAL | Status: DC | PRN

## 2015-06-07 MED ORDER — WHITE PETROLATUM GEL
Status: AC
  Administered 2015-06-07: 0.2
  Filled 2015-06-07: qty 1

## 2015-06-07 NOTE — Discharge Summary (Signed)
Physician Discharge Summary  Patient ID: Ashley Hurst MRN: [id number] DOB/AGE: [birth year] 55 y.o.  Admit date: 06/04/2015 Discharge date: 06/07/2015  Admission Diagnoses: Lumbar spondylosis with lumbar radiculopathy L4-5 L5-S1  Discharge Diagnoses: Lumbar spondylosis with lumbar radiculopathy L4-5 L5-S1 acute blood loss anemia Active Problems:   Lumbar radiculopathy   Discharged Condition: good  Hospital Course: Patient was admitted to undergo anterior decompression and arthroplasty at L4-5 and L5-S1. She tolerated surgery well. Postoperatively her hemoglobin was noted to be 7.2. Is suspected that she had some retroperitoneal bleeding. She was transfused 2 units of blood for acute blood loss anemia and seemed to recover well. Her bowels have moved, her bladder is functioning well, her incision is clean and dry, she feels well  Consults: vascular surgery  Significant Diagnostic Studies: CBC  Treatments: surgery: Anterior lumbar decompression L4-5 L5-S1 arthroplasty with Synthes total disc replacement  Discharge Exam: Blood pressure 129/66, pulse 89, temperature 98.2 F (36.8 C), temperature source Oral, resp. rate 20, SpO2 93 %. Incision is clean and dry motor function is intact station and gait are normal  Disposition: Discharge home  Discharge Instructions    Call MD for:  redness, tenderness, or signs of infection (pain, swelling, redness, odor or green/yellow discharge around incision site)    Complete by:  As directed      Call MD for:  severe uncontrolled pain    Complete by:  As directed      Call MD for:  temperature >100.4    Complete by:  As directed      Diet - low sodium heart healthy    Complete by:  As directed      Discharge instructions    Complete by:  As directed   Okay to shower. Do not apply salves or appointments to incision. No heavy lifting with the upper extremities greater than 15 pounds. May resume driving when not requiring pain medication and patient  feels comfortable with doing so.     Increase activity slowly    Complete by:  As directed             Medication List    TAKE these medications        ALPRAZolam 1 MG tablet  Commonly known as:  XANAX  Take 0.5 mg by mouth 2 (two) times daily. No frequency of medications is noted in list provided by Dr Landis Gandy  [facility]     aspirin EC 81 MG tablet  Take 81 mg by mouth every morning.     busPIRone 10 MG tablet  Commonly known as:  BUSPAR  Take 10 mg by mouth 2 (two) times daily.     citalopram 20 MG tablet  Commonly known as:  CELEXA  Take 20 mg by mouth every morning.     COMPLETE MULTIVITAMIN/MINERAL PO  Take 1 tablet by mouth every morning.     cyanocobalamin 1000 MCG tablet  Take 1,000 mcg by mouth daily.     diazepam 5 MG tablet  Commonly known as:  VALIUM  Take 1 tablet (5 mg total) by mouth every 6 (six) hours as needed for muscle spasms.     estradiol 1 MG tablet  Commonly known as:  ESTRACE  Take 1 mg by mouth daily.     ferrous sulfate 325 (65 FE) MG tablet  Take 325 mg by mouth daily.     Fish Oil 600 MG Caps  Take 600 mg by mouth daily.  fluticasone 50 MCG/ACT nasal spray  Commonly known as:  FLONASE  Place 1 spray into both nostrils daily.     levothyroxine 50 MCG tablet  Commonly known as:  SYNTHROID, LEVOTHROID  Take 50 mcg by mouth daily before breakfast.     lisinopril-hydrochlorothiazide 20-25 MG tablet  Commonly known as:  PRINZIDE,ZESTORETIC  Take 1 tablet by mouth daily.     meloxicam 15 MG tablet  Commonly known as:  MOBIC  Take 15 mg by mouth daily.     oxyCODONE-acetaminophen 5-325 MG tablet  Commonly known as:  PERCOCET/ROXICET  Take 1-2 tablets by mouth every 4 (four) hours as needed for severe pain.     traMADol 50 MG tablet  Commonly known as:  ULTRAM  Take by mouth every 6 (six) hours as needed.     zolpidem 10 MG tablet  Commonly known as:  AMBIEN  Take 10 mg by mouth at bedtime.          SignedEarleen Newport 06/07/2015, 1:41 PM

## 2015-06-07 NOTE — Progress Notes (Signed)
Discharge orders received. Pt and sister educated on discharge instructions and verbalized understanding. Pt given discharge packet and prescriptions. IV removed. Volunteer services called to take patient to main entrance for discharge.

## 2015-06-07 NOTE — Progress Notes (Signed)
Occupational Therapy Treatment Patient Details Name: Ashley Hurst MRN: [id number] DOB: 06/16/1960 Today's Date: 06/07/2015    History of present illness Pt is a 55 y/o female who presents s/p arthroplasty of L4-L5 and L5-S1 on 06/04/15. PMH significant for HTN, anxiety and depression.    OT comments  Pt is making steady progress towards goals.  Session with focus on education on AE to assist with LB bathing, dressing, and toileting hygiene while adhering to back precautions.  Pt with concerns regarding toileting hygiene.  Educated on various techniques to complete hygiene without breaking precautions.  Recommend AE (long handled sponge, shoe horn, sock aid, and reacher to assist with LB dressing.  Pt able to return demonstration of each piece of AE.  Completed toilet transfer and short distance ambulation with RW at overall Mod I level.  Pt anxious to return home.  Follow Up Recommendations  Home health OT    Equipment Recommendations  3 in 1 bedside comode;Other (comment) (AE)    Recommendations for Other Services      Precautions / Restrictions Precautions Precautions: Fall;Back Precaution Booklet Issued: Yes (comment) Precaution Comments: Pt with good recall of precautions after PT session Required Braces or Orthoses: Spinal Brace Spinal Brace: Lumbar corset;Applied in sitting position Restrictions Weight Bearing Restrictions: No       Mobility Bed Mobility               General bed mobility comments: Pt sitting up in chair upon PT arrival.   Transfers Overall transfer level: Modified independent Equipment used: Rolling walker (2 wheeled) Transfers: Sit to/from Stand Sit to Stand: Modified independent (Device/Increase time)                  ADL Overall ADL's : Modified independent                     Lower Body Dressing: Modified independent;With adaptive equipment;Adhering to back precautions;Sit to/from stand Lower Body Dressing Details  (indicate cue type and reason): Pt able to don/doff socks and pants this session with use of AE, Mod I after initial education Toilet Transfer: Modified Independent;RW;BSC                              Cognition   Behavior During Therapy: WFL for tasks assessed/performed Overall Cognitive Status: Within Functional Limits for tasks assessed                                    Pertinent Vitals/ Pain       Pain Assessment: Faces Faces Pain Scale: Hurts a little bit Pain Location: Back Pain Descriptors / Indicators: Operative site guarding Pain Intervention(s): Limited activity within patient's tolerance;Monitored during session;Repositioned         Frequency Min 2X/week     Progress Toward Goals  OT Goals(current goals can now be found in the care plan section)  Progress towards OT goals: Progressing toward goals  Acute Rehab OT Goals Patient Stated Goal: Home today  Plan Discharge plan remains appropriate       End of Session Equipment Utilized During Treatment: Back brace;Rolling walker   Activity Tolerance Patient tolerated treatment well   Patient Left in chair;with call bell/phone within reach   Nurse Communication Mobility status        Time: [id number] OT Time Calculation (min): 27 min  Charges: OT General Charges $OT Visit: 1 Procedure OT Treatments $Self Care/Home Management : 23-37 mins  Simonne Come, [id number] 06/07/2015, 11:02 AM

## 2015-06-07 NOTE — Progress Notes (Signed)
Physical Therapy Treatment Patient Details Name: Ashley Hurst MRN: [id number] DOB: 10/22/1960 Today's Date: 06/07/2015    History of Present Illness Pt is a 55 y/o female who presents s/p arthroplasty of L4-L5 and L5-S1 on 06/04/15. PMH significant for HTN, anxiety and depression.     PT Comments    Pt progressing towards physical therapy goals. Was able to perform transfers and ambulation with modified independence and negotiated 5 stairs with min guard assist. Discussed home environment, and pt is now planning on having car pulled up to front steps (3 total) rather than walk around back through the gravel to enter home as she normally would. Pt reports no further questions for physical therapy, and anticipates d/c home today. Will continue to follow.   Follow Up Recommendations  Outpatient PT;Supervision for mobility/OOB     Equipment Recommendations  Rolling walker with 5" wheels;3in1 (PT)    Recommendations for Other Services       Precautions / Restrictions Precautions Precautions: Fall;Back Precaution Booklet Issued: Yes (comment) Precaution Comments: Pt with good recall of precautions after PT session Required Braces or Orthoses: Spinal Brace Spinal Brace: Lumbar corset;Applied in sitting position Restrictions Weight Bearing Restrictions: No    Mobility  Bed Mobility               General bed mobility comments: Pt sitting up in chair upon PT arrival.   Transfers Overall transfer level: Modified independent Equipment used: Rolling walker (2 wheeled) Transfers: Sit to/from Stand Sit to Stand: Modified independent (Device/Increase time)         General transfer comment: Pt demonstrated proper hand placement on seated surface for safety.   Ambulation/Gait Ambulation/Gait assistance: Modified independent (Device/Increase time) Ambulation Distance (Feet): 400 Feet Assistive device: Rolling walker (2 wheeled) Gait Pattern/deviations: Step-through  pattern;Decreased stride length Gait velocity: Decreased Gait velocity interpretation: Below normal speed for age/gender General Gait Details: Pt continues to appear guarded with ambulation however was overall at a modified independent level during gait training. No LOB or unsteadiness noted.    Stairs Stairs: Yes Stairs assistance: Min guard Stair Management: Two rails;Step to pattern;Forwards Number of Stairs: 5 Stair training details: Pt was cued for sequencing and general safety awareness with stair negotiation. No assist required but close guard provided.  Wheelchair Mobility    Modified Rankin (Stroke Patients Only)       Balance Overall balance assessment: Needs assistance Sitting-balance support: Feet supported;No upper extremity supported Sitting balance-Leahy Scale: Good     Standing balance support: No upper extremity supported Standing balance-Leahy Scale: Fair                      Cognition Arousal/Alertness: Awake/alert Behavior During Therapy: WFL for tasks assessed/performed Overall Cognitive Status: Within Functional Limits for tasks assessed                      Exercises      General Comments        Pertinent Vitals/Pain Pain Assessment: Faces Faces Pain Scale: Hurts a little bit Pain Location: Back Pain Descriptors / Indicators: Operative site guarding Pain Intervention(s): Limited activity within patient's tolerance;Monitored during session;Repositioned    Home Living                      Prior Function            PT Goals (current goals can now be found in the care plan section) Acute Rehab  PT Goals Patient Stated Goal: Home today PT Goal Formulation: With patient Time For Goal Achievement: 06/12/15 Potential to Achieve Goals: Good Progress towards PT goals: Progressing toward goals    Frequency  Min 5X/week    PT Plan Current plan remains appropriate    Co-evaluation             End of  Session Equipment Utilized During Treatment: Back brace Activity Tolerance: Patient tolerated treatment well Patient left: in chair;with call bell/phone within reach     Time: [id number] PT Time Calculation (min) (ACUTE ONLY): 26 min  Charges:  $Gait Training: 23-37 mins                    G Codes:      Rolinda Roan June 19, 2015, 11:28 AM   Rolinda Roan, PT, DPT Acute Rehabilitation Services Pager: [phone]

## 2015-06-07 NOTE — Progress Notes (Signed)
CM worked throughout the day to obtain DME for patient prior to discharge.  DME was to be delivered this morning to the hospital, but [facility] has been unable to secure delivery through their provider [employer].  Per Cathlean Marseilles at [facility] [phone], none of their in-network DME companies are able to deliver the equipment until Monday. CM met with patient, who has decided to pay out of pocket for her rolling walker through [facility] at this time.  [facility] was notified and will deliver the walker to her room prior to discharge.  All other equipment will be delivered to patient's home Monday by [facility].  Patient is agreeable to this plan.  Bedside RN updated.  Lorne Skeens RN, MSN [phone]

## 2015-06-12 ENCOUNTER — Encounter (HOSPITAL_COMMUNITY): Payer: Self-pay | Admitting: Neurological Surgery

## 2015-06-14 ENCOUNTER — Ambulatory Visit (INDEPENDENT_AMBULATORY_CARE_PROVIDER_SITE_OTHER): Payer: Self-pay | Admitting: Pharmacist

## 2015-06-14 ENCOUNTER — Other Ambulatory Visit: Payer: Self-pay

## 2015-06-14 ENCOUNTER — Ambulatory Visit: Payer: Self-pay | Admitting: Cardiovascular Disease

## 2015-06-14 ENCOUNTER — Ambulatory Visit (HOSPITAL_COMMUNITY)
Admission: RE | Admit: 2015-06-14 | Discharge: 2015-06-14 | Disposition: A | Discharge status: Home / Self Care | Payer: Worker's Compensation | Source: Ambulatory Visit | Attending: Cardiology | Admitting: Cardiology

## 2015-06-14 ENCOUNTER — Telehealth: Payer: Self-pay | Admitting: *Deleted

## 2015-06-14 DIAGNOSIS — I82442 Acute embolism and thrombosis of left tibial vein: Secondary | ICD-10-CM | POA: Insufficient documentation

## 2015-06-14 DIAGNOSIS — I82413 Acute embolism and thrombosis of femoral vein, bilateral: Secondary | ICD-10-CM | POA: Insufficient documentation

## 2015-06-14 DIAGNOSIS — I82402 Acute embolism and thrombosis of unspecified deep veins of left lower extremity: Secondary | ICD-10-CM | POA: Insufficient documentation

## 2015-06-14 DIAGNOSIS — E785 Hyperlipidemia, unspecified: Secondary | ICD-10-CM | POA: Insufficient documentation

## 2015-06-14 DIAGNOSIS — I82433 Acute embolism and thrombosis of popliteal vein, bilateral: Secondary | ICD-10-CM | POA: Diagnosis not present

## 2015-06-14 DIAGNOSIS — I10 Essential (primary) hypertension: Secondary | ICD-10-CM | POA: Diagnosis not present

## 2015-06-14 NOTE — Progress Notes (Signed)
Patient was scheduled for a Anticoagulation visit today but is not on any anticoagulation medications per hospital discharge note.  The patient states that she was diagnosed with a blood clot on 06/13/15. I called Dr. Clarice Pole office & spoke with Denny Peon & Lori-who stated Dr. Ellene Route wanted the pt seen for a left leg DVT that was called to them from [facility] on 06/13/15 due to the [place] reporting on 06/12/15 that the pt's left leg was swollen and she was complaining of pain.  Dr. Clarice Pole office did not have the official report of the Ultrasound & had no way to get it. Therefore, after consulting with our Doctor of the Day-Dr. Harrington Challenger, it was determined to have a Ultrasound completed today and then determine treatment at that point. The patient & family member were willing to follow through with the plan of care discussed with them.  We were able to get her scheduled for an ultrasound today at our [facility] office. The family member & patient was given directions to the [place] location to get the ultrasound completed.  Also, they understand that after the Ultrasound they will follow-up with our Pharmacist-Kristin at the [facility] location to continue with the plan of care.  Sally-Pharmacist at [facility] location notified Erasmo Downer, Software engineer at [place] with this information.

## 2015-06-14 NOTE — Telephone Encounter (Signed)
Patient presented to clinic today for coumadin initiation.  No report available for LE clots.  Given this recomm that she have scan to determine location/severity  Pt went to [facility] office for this procedure.  Ultrasound showed extensive DVT of the R and L femoral veins. Pt seen by Pharm D at [facility] and instructions for Xarelto given.  Pt did not return to [facility] office to be seen by me She did not want to come back to [place]  Agreed to appt on Monday in [place] office  I have spoken with the patient  Instructed her to take it easy over weekend.  Reviewed Xarelto dosing. I also told her to watch for bleeding If she notices dizziness, SOB or bleeding she was told to call EMS and go to [facility]. Pt understands and agrees with plan.  Dorris Carnes

## 2015-06-14 NOTE — Progress Notes (Signed)
Patient in [place] office for lower extremity ultrasound.  Noted to have bilateral DVTs.  Consulted with patient for 20 plus minutes on need for anticoagulation for this provoked event.  Started her on Xarelto 15 mg bid x 3 weeks, with breakfast and dinner and gave samples for the entire 3 weeks.  After that she will need to be prescribed Xarelto 20 mg once daily with meal.  Reviewed safety information, side effects and answered questions from both patient and her sister.  Afterward I spoke with Dr. Harrington Challenger and she asked that patient head back over to [facility] office to be added onto [employer] schedule for today.  Patient was already part way back to [facility], so she was scheduled to see Dr. Fletcher Anon at that office on Monday.

## 2015-06-14 NOTE — Telephone Encounter (Signed)
Dr. Harrington Challenger reviewed patient's chart, recommends patient have CBC drawn due to her last labs in [facility] were on 06/05/15 and at that time her hgb was 7.2.  She received a blood transfusion at that time.  There is no CBC post transfusion in [facility].  Today she was started on Xarelto.    I called the [facility] office and made an appointment for her to have a CBC this afternoon before 5 pm.   I called the patient to instruct her to go the office for the blood work.  She states she cannot go back out. She does not have a ride currently, and she is in pain/uncomfortable from riding in the car today. Pt stated that she has home care and provided me with [facility] number.  Spoke to [place], who states that they are only seeing her for PT, not nursing.  Dr. Harrington Challenger aware and has called the patient at this time.

## 2015-06-17 ENCOUNTER — Encounter: Payer: Self-pay | Admitting: Cardiovascular Disease

## 2015-06-17 ENCOUNTER — Ambulatory Visit (INDEPENDENT_AMBULATORY_CARE_PROVIDER_SITE_OTHER): Payer: Worker's Compensation | Admitting: Cardiovascular Disease

## 2015-06-17 ENCOUNTER — Telehealth: Payer: Self-pay

## 2015-06-17 ENCOUNTER — Emergency Department (HOSPITAL_COMMUNITY)
Admission: EM | Admit: 2015-06-17 | Discharge: 2015-06-18 | Disposition: A | Discharge status: Home / Self Care | Payer: Worker's Compensation | Attending: Emergency Medicine | Admitting: Emergency Medicine

## 2015-06-17 ENCOUNTER — Encounter (HOSPITAL_COMMUNITY): Payer: Self-pay | Admitting: Family Medicine

## 2015-06-17 VITALS — BP 96/59 | HR 91 | Ht 64.5 in | Wt 151.0 lb

## 2015-06-17 DIAGNOSIS — E039 Hypothyroidism, unspecified: Secondary | ICD-10-CM | POA: Diagnosis not present

## 2015-06-17 DIAGNOSIS — I82413 Acute embolism and thrombosis of femoral vein, bilateral: Secondary | ICD-10-CM

## 2015-06-17 DIAGNOSIS — Z7982 Long term (current) use of aspirin: Secondary | ICD-10-CM | POA: Insufficient documentation

## 2015-06-17 DIAGNOSIS — Z79818 Long term (current) use of other agents affecting estrogen receptors and estrogen levels: Secondary | ICD-10-CM | POA: Diagnosis not present

## 2015-06-17 DIAGNOSIS — R2243 Localized swelling, mass and lump, lower limb, bilateral: Secondary | ICD-10-CM | POA: Diagnosis present

## 2015-06-17 DIAGNOSIS — I10 Essential (primary) hypertension: Secondary | ICD-10-CM | POA: Insufficient documentation

## 2015-06-17 DIAGNOSIS — E785 Hyperlipidemia, unspecified: Secondary | ICD-10-CM | POA: Diagnosis not present

## 2015-06-17 DIAGNOSIS — Z7951 Long term (current) use of inhaled steroids: Secondary | ICD-10-CM | POA: Insufficient documentation

## 2015-06-17 DIAGNOSIS — Z79899 Other long term (current) drug therapy: Secondary | ICD-10-CM | POA: Diagnosis not present

## 2015-06-17 DIAGNOSIS — Z791 Long term (current) use of non-steroidal anti-inflammatories (NSAID): Secondary | ICD-10-CM | POA: Insufficient documentation

## 2015-06-17 DIAGNOSIS — F419 Anxiety disorder, unspecified: Secondary | ICD-10-CM | POA: Insufficient documentation

## 2015-06-17 DIAGNOSIS — Z7901 Long term (current) use of anticoagulants: Secondary | ICD-10-CM | POA: Diagnosis not present

## 2015-06-17 DIAGNOSIS — F329 Major depressive disorder, single episode, unspecified: Secondary | ICD-10-CM | POA: Insufficient documentation

## 2015-06-17 LAB — CBC WITH DIFFERENTIAL/PLATELET
BASOS ABS: 0.1 10*3/uL (ref 0.0–0.1)
Basophils Relative: 1 %
EOS PCT: 4 %
Eosinophils Absolute: 0.3 10*3/uL (ref 0.0–0.7)
HCT: 31.4 % — ABNORMAL LOW (ref 36.0–46.0)
Hemoglobin: 10.3 g/dL — ABNORMAL LOW (ref 12.0–15.0)
LYMPHS ABS: 2.1 10*3/uL (ref 0.7–4.0)
LYMPHS PCT: 30 %
MCH: 30.3 pg (ref 26.0–34.0)
MCHC: 32.8 g/dL (ref 30.0–36.0)
MCV: 92.4 fL (ref 78.0–100.0)
MONO ABS: 0.4 10*3/uL (ref 0.1–1.0)
Monocytes Relative: 6 %
Neutro Abs: 4.3 10*3/uL (ref 1.7–7.7)
Neutrophils Relative %: 59 %
PLATELETS: 352 10*3/uL (ref 150–400)
RBC: 3.4 MIL/uL — ABNORMAL LOW (ref 3.87–5.11)
RDW: 16.3 % — AB (ref 11.5–15.5)
WBC: 7.2 10*3/uL (ref 4.0–10.5)

## 2015-06-17 MED ORDER — OXYCODONE-ACETAMINOPHEN 5-325 MG PO TABS
2.0000 | ORAL_TABLET | Freq: Once | ORAL | Status: AC
  Administered 2015-06-17: 2 via ORAL
  Filled 2015-06-17: qty 2

## 2015-06-17 NOTE — Telephone Encounter (Signed)
S/w Lorri, CMA, at [facility] to update her that patient is proceeding to [facility]> Lorri verbalized understanding and will notify Dr. Ellene Route

## 2015-06-17 NOTE — ED Notes (Addendum)
Pt sent here for blood clots in groin area. sts recent back surgery. Pt has started taking xarelto. Pt left leg swollen and painful.

## 2015-06-17 NOTE — Assessment & Plan Note (Signed)
The patient has extensive bilateral DVT extending to the femoral vein which is completely occlusive on the left side. This is likely a complication to her recent surgery. She was started on anticoagulation 3 days ago but she continues to have significant swelling on the left side with severe tenderness. She is at significant risk for pulmonary embolism as well as post-thrombotic syndrome. She might require catheter-based intervention especially on the left side. In top of that, the patient had anemia which required transfusion post surgery and currently she is on anticoagulation. I don't have a CBC level. I left a message to Dr. Scot Dock and we also notified Dr. Clarice Pole office. I think the best route at the present time is to send the patient to the emergency room at [facility] for further consultation with vascular surgery and neurosurgery. The patient is not sick enough to be taken by ambulance. Nonetheless, she will need somebody to take her to the emergency room as I gave her recommendations not to drive at the present time given her extensive DVT.

## 2015-06-17 NOTE — ED Provider Notes (Signed)
CSN: [id number]     Arrival date & time 06/17/15  1437 History   First MD Initiated Contact with Patient 06/17/15 2137     Chief Complaint  Patient presents with  . Leg Swelling     The history is provided by the patient. No language interpreter was used.   Ashley Hurst is a 55 y.o. female who presents to the Emergency Department complaining of leg swelling.  She had Lumbar surgery on Tuesday, 06/04/15.  She has had BLE edema since her surgery.  She had Ashley outpatient ultrasound performed three days ago that showed bilateral DVT and she was started on Xarelto.  Today she was seen [facility] and was told to go to the ER.  She has occasional black stools over the last two weeks.  No bloody stools.  No chest pain, SOB.  Her surgery was performed by Drs Scot Dock and Elsner.    Past Medical History  Diagnosis Date  . Hypertension   . Hyperlipidemia   . Depression   . Hypothyroidism   . Anxiety   . Shortness of breath dyspnea    Past Surgical History  Procedure Laterality Date  . Knee surgery Left 1993  . Hernia repair    . Anterior lumbar disc arthroplasty N/A 06/04/2015    Procedure: L4-5 L5-S1 Lumbar artificial disc replacement with Dr. Scot Dock for approach;  Surgeon: Kristeen Miss, MD;  Location: [facility] NEURO ORS;  Service: Neurosurgery;  Laterality: N/A;  L4-5 L5-S1 Lumbar artificial disc replacement with Dr. Scot Dock for approach  . Abdominal exposure N/A 06/04/2015    Procedure: ABDOMINAL EXPOSURE;  Surgeon: Angelia Mould, MD;  Location: [facility] NEURO ORS;  Service: Vascular;  Laterality: N/A;  ABDOMINAL EXPOSURE   History reviewed. No pertinent family history. Social History  Substance Use Topics  . Smoking status: Never Smoker   . Smokeless tobacco: Never Used  . Alcohol Use: Yes     Comment: wwekly   OB History    No data available     Review of Systems  All other systems reviewed and are negative.     Allergies  Review of patient's allergies indicates no  known allergies.  Home Medications   Prior to Admission medications   Medication Sig Start Date End Date Taking? Authorizing Provider  ALPRAZolam Duanne Moron) 1 MG tablet Take 0.5 mg by mouth 2 (two) times daily as needed for anxiety. No frequency of medications is noted in list provided by Dr Landis Gandy  [facility]   Yes Historical Provider, MD  aspirin EC 81 MG tablet Take 81 mg by mouth every morning.   Yes Historical Provider, MD  busPIRone (BUSPAR) 10 MG tablet Take 10 mg by mouth 2 (two) times daily.   Yes Historical Provider, MD  citalopram (CELEXA) 20 MG tablet Take 20 mg by mouth every morning. 05/02/15  Yes Historical Provider, MD  cyanocobalamin 1000 MCG tablet Take 1,000 mcg by mouth daily.   Yes Historical Provider, MD  diazepam (VALIUM) 5 MG tablet Take 1 tablet (5 mg total) by mouth every 6 (six) hours as needed for muscle spasms. 06/07/15  Yes Kristeen Miss, MD  estradiol (ESTRACE) 1 MG tablet Take 1 mg by mouth daily.    Yes Historical Provider, MD  ferrous sulfate 325 (65 FE) MG tablet Take 325 mg by mouth daily. 03/08/15  Yes Historical Provider, MD  fluticasone (FLONASE) 50 MCG/ACT nasal spray Place 1 spray into both nostrils as needed for allergies.  Yes Historical Provider, MD  levothyroxine (SYNTHROID, LEVOTHROID) 50 MCG tablet Take 50 mcg by mouth daily before breakfast.   Yes Historical Provider, MD  lisinopril-hydrochlorothiazide (PRINZIDE,ZESTORETIC) 20-25 MG tablet Take 1 tablet by mouth daily.    Yes Historical Provider, MD  meloxicam (MOBIC) 15 MG tablet Take 15 mg by mouth daily.    Yes Historical Provider, MD  Multiple Vitamins-Minerals (COMPLETE MULTIVITAMIN/MINERAL PO) Take 1 tablet by mouth every morning.   Yes Historical Provider, MD  Omega-3 Fatty Acids (FISH OIL) 600 MG CAPS Take 600 mg by mouth daily.   Yes Historical Provider, MD  oxyCODONE-acetaminophen (PERCOCET/ROXICET) 5-325 MG tablet Take 1-2 tablets by mouth every 4 (four) hours as needed  for severe pain. 06/07/15  Yes Kristeen Miss, MD  traMADol (ULTRAM) 50 MG tablet Take 50 mg by mouth every 6 (six) hours as needed for moderate pain.  04/19/15  Yes Historical Provider, MD  zolpidem (AMBIEN) 10 MG tablet Take 10 mg by mouth at bedtime.    Yes Historical Provider, MD   BP 109/65 mmHg  Pulse 82  Temp(Src) 97.7 F (36.5 C)  Resp 16  SpO2 98% Physical Exam  Constitutional: She is oriented to person, place, and time. She appears well-developed and well-nourished.  HENT:  Head: Normocephalic and atraumatic.  Cardiovascular: Normal rate and regular rhythm.   No murmur heard. Pulmonary/Chest: Effort normal and breath sounds normal. No respiratory distress.  Abdominal: Soft. There is no tenderness. There is no rebound and no guarding.  Surgical wound in the left lower quadrant is clean, dry, intact  Musculoskeletal: She exhibits no edema or tenderness.  2+ DP pulses, 1+ edema in LLE.  Calves are TTP to bilaterally.    Neurological: She is alert and oriented to person, place, and time.  Skin: Skin is warm and dry.  Psychiatric: She has a normal mood and affect. Her behavior is normal.  Nursing note and vitals reviewed.   ED Course  Procedures (including critical care time) Labs Review Labs Reviewed  BASIC METABOLIC PANEL - Abnormal; Notable for the following:    Calcium 8.8 (*)    All other components within normal limits  CBC WITH DIFFERENTIAL/PLATELET - Abnormal; Notable for the following:    RBC 3.40 (*)    Hemoglobin 10.3 (*)    HCT 31.4 (*)    RDW 16.3 (*)    All other components within normal limits    Imaging Review No results found. I have personally reviewed and evaluated these images and lab results as part of my medical decision-making.   EKG Interpretation None      MDM   Final diagnoses:  Acute bilateral deep vein thrombosis (DVT) of femoral veins [facility])    Patient here for evaluation following diagnosis of bilateral lower [place] E DVTs  postoperatively. She is currently on thrill to therapy with appropriate twice a day dosing for the first 21 days. She is well perfused on examination with soft compartments, no evidence of acute infection. Her pain is controlled on her oral whole medications. She has no symptoms concerning for pulmonary embolism. Reviewed outpatient imaging. Vascular ultrasound from January 13 demonstrates bilateral lower extremity DVT as proximal as the common femoral veins with a clot is floating. The left groin has a totally occlusive DVT through to the popliteal vein. Discussed the patient with Dr. Donnetta Hutching with vascular surgery who recommends continuing anticoagulation with outpatient follow-up and return precautions. Discussed with patient plans to continue her anticoagulation with close follow-up and return  precautions. Patient was anemic during her hospital stay and received a blood transfusion, repeat CBC is improved from priors. She has brown stool on rectal exam that is heme-negative. Discussed with her return precautions for bleeding as well as precautions for progressive DVT/PE.    Quintella Reichert, MD 06/18/15 [phone]

## 2015-06-17 NOTE — ED Notes (Signed)
Pt called for triage.  NO response.

## 2015-06-17 NOTE — Progress Notes (Signed)
HPI  This is a 55 year old female who was admitted to my schedule for extensive bilateral DVT. She has known history of lumbar spondylosis with lumbar radiculopathic. She underwent elective anterior decompression and arthroplasty at L4, L5 and L5-S1 on January 3. Dr. Scot Dock assisted with the surgery with dissection above the iliac arteries and exposure of the left iliac vein. This was retracted to the right side to allow exposure of the spine. Postoperative course was complicated by acute blood loss anemia which required blood transfusion. The patient was discharged home . Shortly post discharge, she noticed significant bilateral leg swelling worse on the left side. This became associated with significant pain. She underwent an ultrasound at her house which was suggestive of DVT and thus she was sent for an official lower extremity venous duplex in our office in [place]. This showed extensive bilateral DVT extending to the femoral veins which was totally occlusive on the left side and partially occlusive on the right side. The patient was started on Xarelto with no significant improvement in swelling. She reports significant pain especially on the left side. No previous cardiac history. She denies any chest pain or significant shortness of breath. She is not a smoker. She has family history of hypertension.  No Known Allergies   Current Outpatient Prescriptions on File Prior to Visit  Medication Sig Dispense Refill  . ALPRAZolam (XANAX) 1 MG tablet Take 0.5 mg by mouth 2 (two) times daily as needed. No frequency of medications is noted in list provided by Dr Landis Gandy  [facility]    . aspirin EC 81 MG tablet Take 81 mg by mouth every morning.    . busPIRone (BUSPAR) 10 MG tablet Take 10 mg by mouth 2 (two) times daily.    . citalopram (CELEXA) 20 MG tablet Take 20 mg by mouth every morning.  3  . cyanocobalamin 1000 MCG tablet Take 1,000 mcg by mouth daily.    . diazepam  (VALIUM) 5 MG tablet Take 1 tablet (5 mg total) by mouth every 6 (six) hours as needed for muscle spasms. 60 tablet 0  . estradiol (ESTRACE) 1 MG tablet Take 1 mg by mouth daily.     . ferrous sulfate 325 (65 FE) MG tablet Take 325 mg by mouth daily.  1  . fluticasone (FLONASE) 50 MCG/ACT nasal spray Place 1 spray into both nostrils as needed.     Marland Kitchen levothyroxine (SYNTHROID, LEVOTHROID) 50 MCG tablet Take 50 mcg by mouth daily before breakfast.    . lisinopril-hydrochlorothiazide (PRINZIDE,ZESTORETIC) 20-25 MG tablet Take 1 tablet by mouth daily.     . meloxicam (MOBIC) 15 MG tablet Take 15 mg by mouth daily.     . Multiple Vitamins-Minerals (COMPLETE MULTIVITAMIN/MINERAL PO) Take 1 tablet by mouth every morning.    . Omega-3 Fatty Acids (FISH OIL) 600 MG CAPS Take 600 mg by mouth daily.    Marland Kitchen oxyCODONE-acetaminophen (PERCOCET/ROXICET) 5-325 MG tablet Take 1-2 tablets by mouth every 4 (four) hours as needed for severe pain. 60 tablet 0  . traMADol (ULTRAM) 50 MG tablet Take by mouth every 6 (six) hours as needed.  3  . zolpidem (AMBIEN) 10 MG tablet Take 10 mg by mouth at bedtime.      No current facility-administered medications on file prior to visit.     Past Medical History  Diagnosis Date  . Hypertension   . Hyperlipidemia   . Depression   . Hypothyroidism   . Anxiety   .  Shortness of breath dyspnea      Past Surgical History  Procedure Laterality Date  . Knee surgery Left 1993  . Hernia repair    . Anterior lumbar disc arthroplasty N/A 06/04/2015    Procedure: L4-5 L5-S1 Lumbar artificial disc replacement with Dr. Scot Dock for approach;  Surgeon: Kristeen Miss, MD;  Location: [facility] NEURO ORS;  Service: Neurosurgery;  Laterality: N/A;  L4-5 L5-S1 Lumbar artificial disc replacement with Dr. Scot Dock for approach  . Abdominal exposure N/A 06/04/2015    Procedure: ABDOMINAL EXPOSURE;  Surgeon: Angelia Mould, MD;  Location: [facility] NEURO ORS;  Service: Vascular;  Laterality: N/A;   ABDOMINAL EXPOSURE     History reviewed. No pertinent family history.   Social History   Social History  . Marital Status: Single    Spouse Name: N/A  . Number of Children: N/A  . Years of Education: N/A   Occupational History  . Not on file.   Social History Main Topics  . Smoking status: Never Smoker   . Smokeless tobacco: Never Used  . Alcohol Use: Yes     Comment: wwekly  . Drug Use: No  . Sexual Activity: Not on file   Other Topics Concern  . Not on file   Social History Narrative     ROS A 10 point review of system was performed. It is negative other than that mentioned in the history of present illness.   PHYSICAL EXAM   BP 96/59 mmHg  Pulse 91  Ht 5' 4.5" (1.638 m)  Wt 151 lb (68.493 kg)  BMI 25.53 kg/m2 Constitutional: She is oriented to person, place, and time. She appears well-developed and well-nourished. No distress.  HENT: No nasal discharge.  Head: Normocephalic and atraumatic.  Eyes: Pupils are equal and round. No discharge.  Neck: Normal range of motion. Neck supple. No JVD present. No thyromegaly present.  Cardiovascular: Normal rate, regular rhythm, normal heart sounds. Exam reveals no gallop and no friction rub. There is a 2/6 holosystolic murmur at the left sternal border.  Pulmonary/Chest: Effort normal and breath sounds normal. No stridor. No respiratory distress. She has no wheezes. She has no rales. She exhibits no tenderness.  Abdominal: Soft. Bowel sounds are normal. She exhibits no distension. There is no tenderness. There is no rebound and no guarding.  Musculoskeletal: Normal range of motion. She exhibits significant bilateral swelling affecting both legs especially on the left side with severe tenderness on the left side.  Neurological: She is alert and oriented to person, place, and time. Coordination normal.  Skin: Skin is warm and dry. No rash noted. She is not diaphoretic. No erythema. No pallor.  Psychiatric: She has a normal  mood and affect. Her behavior is normal. Judgment and thought content normal.       ASSESSMENT AND PLAN

## 2015-06-17 NOTE — Telephone Encounter (Signed)
Per Dr. Fletcher Anon, contact Dr. Ellene Route, who performed Jan 3 surgery, or his nurse regarding pt with bilateral DVTs. S/w Hassan Rowan at [facility] Neuro[facility] who states Dr. Clarice Pole CMA, Jovita Gamma, is at lunch. Left detailed message regarding pt and need to be seen urgently. Left call back number for Lorri. Dr. Fletcher Anon has instructed pt to proceed to [facility] ED.  Pt is here in the office and verbalized understanding. She has a friend with her who will drive her to [facility].

## 2015-06-17 NOTE — Patient Instructions (Addendum)
Medication Instructions:  Your physician recommends that you continue on your current medications as directed. Please refer to the Current Medication list given to you today.   Labwork: none  Testing/Procedures: none  Follow-Up: Your physician recommends that you schedule a follow-up appointment with your surgeon, Dr. Ellene Route   Any Other Special Instructions Will Be Listed Below (If Applicable). Pt has been instructed to go to the Emergency Room at [facility].      If you need a refill on your cardiac medications before your next appointment, please call your pharmacy.

## 2015-06-17 NOTE — Assessment & Plan Note (Signed)
Blood pressure is running on the low side which is concerning. Hold lisinopril-hydrochlorothiazide for now.

## 2015-06-18 LAB — BASIC METABOLIC PANEL
Anion gap: 10 (ref 5–15)
BUN: 11 mg/dL (ref 6–20)
CALCIUM: 8.8 mg/dL — AB (ref 8.9–10.3)
CO2: 26 mmol/L (ref 22–32)
CREATININE: 0.73 mg/dL (ref 0.44–1.00)
Chloride: 104 mmol/L (ref 101–111)
GFR calc non Af Amer: >60 mL/min (ref >60)
Glucose, Bld: 75 mg/dL (ref 65–99)
Potassium: 4.1 mmol/L (ref 3.5–5.1)
SODIUM: 140 mmol/L (ref 135–145)

## 2015-06-18 MED ORDER — RIVAROXABAN 15 MG PO TABS
15.0000 mg | ORAL_TABLET | Freq: Once | ORAL | Status: AC
  Administered 2015-06-18: 15 mg via ORAL
  Filled 2015-06-18: qty 1

## 2015-06-18 NOTE — ED Notes (Signed)
Pt. resting with no distress , respirations unlabored , pt. Reminded of current time and advised her that she can start calling her family/friends at 6 am for transport back home .

## 2015-06-18 NOTE — ED Notes (Addendum)
EDP advised RN that pt. can stay in a pt.'s room  while waiting for her ride home  .

## 2015-06-18 NOTE — ED Notes (Signed)
Pt. sleeping with no distress/respirations unlabored .  

## 2015-06-18 NOTE — Discharge Instructions (Signed)
Be sure to take your Xarelto as directed.  Get rechecked immediately if you develop chest pain, shortness of breath or new concerning symptoms.     Deep Vein Thrombosis A deep vein thrombosis (DVT) is a blood clot (thrombus) that usually occurs in a deep, larger vein of the lower leg or the pelvis, or in an upper extremity such as the arm. These are dangerous and can lead to serious and even life-threatening complications if the clot travels to the lungs. A DVT can damage the valves in your leg veins so that instead of flowing upward, the blood pools in the lower leg. This is called post-thrombotic syndrome, and it can result in pain, swelling, discoloration, and sores on the leg. CAUSES A DVT is caused by the formation of a blood clot in your leg, pelvis, or arm. Usually, several things contribute to the formation of blood clots. A clot may develop when:  Your blood flow slows down.  Your vein becomes damaged in some way.  You have a condition that makes your blood clot more easily. RISK FACTORS A DVT is more likely to develop in:  People who are older, especially over 75 years of age.  People who are overweight (obese).  People who sit or lie still for a long time, such as during long-distance travel (over 4 hours), bed rest, hospitalization, or during recovery from certain medical conditions like a stroke.  People who do not engage in much physical activity (sedentary lifestyle).  People who have chronic breathing disorders.  People who have a personal or family history of blood clots or blood clotting disease.  People who have peripheral vascular disease (PVD), diabetes, or some types of cancer.  People who have heart disease, especially if the person had a recent heart attack or has congestive heart failure.  People who have neurological diseases that affect the legs (leg paresis).  People who have had a traumatic injury, such as breaking a hip or leg.  People who have  recently had major or lengthy surgery, especially on the hip, knee, or abdomen.  People who have had a central line placed inside a large vein.  People who take medicines that contain the hormone estrogen. These include birth control pills and hormone replacement therapy.  Pregnancy or during childbirth or the postpartum period.  Long plane flights (over 8 hours). SIGNS AND SYMPTOMS Symptoms of a DVT can include:   Swelling of your leg or arm, especially if one side is much worse.  Warmth and redness of your leg or arm, especially if one side is much worse.  Pain in your arm or leg. If the clot is in your leg, symptoms may be more noticeable or worse when you stand or walk.  A feeling of pins and needles, if the clot is in the arm. The symptoms of a DVT that has traveled to the lungs (pulmonary embolism, PE) usually start suddenly and include:  Shortness of breath while active or at rest.  Coughing or coughing up blood or blood-tinged mucus.  Chest pain that is often worse with deep breaths.  Rapid or irregular heartbeat.  Feeling light-headed or dizzy.  Fainting.  Feeling anxious.  Sweating. There may also be pain and swelling in a leg if that is where the blood clot started. These symptoms may represent a serious problem that is an emergency. Do not wait to see if the symptoms will go away. Get medical help right away. Call your local emergency services (911  in the [place] Do not drive yourself to the hospital. DIAGNOSIS Your health care provider will take a medical history and perform a physical exam. You may also have other tests, including:  Blood tests to assess the clotting properties of your blood.  Imaging tests, such as CT, ultrasound, MRI, X-ray, and other tests to see if you have clots anywhere in your body. TREATMENT After a DVT is identified, it can be treated. The type of treatment that you receive depends on many factors, such as the cause of your DVT, your  risk for bleeding or developing more clots, and other medical conditions that you have. Sometimes, a combination of treatments is necessary. Treatment options may be combined and include:  Monitoring the blood clot with ultrasound.  Taking medicines by mouth, such as newer blood thinners (anticoagulants), thrombolytics, or warfarin.  Taking anticoagulant medicine by injection or through an IV tube.  Wearing compression stockings or using different types ofdevices.  Surgery (rare) to remove the blood clot or to place a filter in your abdomen to stop the blood clot from traveling to your lungs. Treatments for a DVT are often divided into immediate treatment and long-term treatment (up to 3 months after DVT). You can work with your health care provider to choose the treatment program that is best for you. HOME CARE INSTRUCTIONS If you are taking a newer oral anticoagulant:  Take the medicine every single day at the same time each day.  Understand what foods and drugs interact with this medicine.  Understand that there are no regular blood tests required when using this medicine.  Understand the side effects of this medicine, including excessive bruising or bleeding. Ask your health care provider or pharmacist about other possible side effects. If you are taking warfarin:  Understand how to take warfarin and know which foods can affect how warfarin works in Veterinary surgeon.  Understand that it is dangerous to take too much or too little warfarin. Too much warfarin increases the risk of bleeding. Too little warfarin continues to allow the risk for blood clots.  Follow your PT and INR blood testing schedule. The PT and INR results allow your health care provider to adjust your dose of warfarin. It is very important that you have your PT and INR tested as often as told by your health care provider.  Avoid major changes in your diet, or tell your health care provider before you change your diet.  Arrange a visit with a registered dietitian to answer your questions. Many foods, especially foods that are high in vitamin K, can interfere with warfarin and affect the PT and INR results. Eat a consistent amount of foods that are high in vitamin K, such as:  Spinach, kale, broccoli, cabbage, collard greens, turnip greens, Brussels sprouts, peas, cauliflower, seaweed, and parsley.  Beef liver and pork liver.  Green tea.  Soybean oil.  Tell your health care provider about any and all medicines, vitamins, and supplements that you take, including aspirin and other over-the-counter anti-inflammatory medicines. Be especially cautious with aspirin and anti-inflammatory medicines. Do not take those before you ask your health care provider if it is safe to do so. This is important because many medicines can interfere with warfarin and affect the PT and INR results.  Do not start or stop taking any over-the-counter or prescription medicine unless your health care provider or pharmacist tells you to do so. If you take warfarin, you will also need to do these things:  Hold  pressure over cuts for longer than usual.  Tell your dentist and other health care providers that you are taking warfarin before you have any procedures in which bleeding may occur.  Avoid alcohol or drink very small amounts. Tell your health care provider if you change your alcohol intake.  Do not use tobacco products, including cigarettes, chewing tobacco, and e-cigarettes. If you need help quitting, ask your health care provider.  Avoid contact sports. General Instructions  Take over-the-counter and prescription medicines only as told by your health care provider. Anticoagulant medicines can have side effects, including easy bruising and difficulty stopping bleeding. If you are prescribed an anticoagulant, you will also need to do these things:  Hold pressure over cuts for longer than usual.  Tell your dentist and other  health care providers that you are taking anticoagulants before you have any procedures in which bleeding may occur.  Avoid contact sports.  Wear a medical alert bracelet or carry a medical alert card that says you have had a PE.  Ask your health care provider how soon you can go back to your normal activities. Stay active to prevent new blood clots from forming.  Make sure to exercise while traveling or when you have been sitting or standing for a long period of time. It is very important to exercise. Exercise your legs by walking or by tightening and relaxing your leg muscles often. Take frequent walks.  Wear compression stockings as told by your health care provider to help prevent more blood clots from forming.  Do not use tobacco products, including cigarettes, chewing tobacco, and e-cigarettes. If you need help quitting, ask your health care provider.  Keep all follow-up appointments with your health care provider. This is important. PREVENTION Take these actions to decrease your risk of developing another DVT:  Exercise regularly. For at least 30 minutes every day, engage in:  Activity that involves moving your arms and legs.  Activity that encourages good blood flow through your body by increasing your heart rate.  Exercise your arms and legs every hour during long-distance travel (over 4 hours). Drink plenty of water and avoid drinking alcohol while traveling.  Avoid sitting or lying in bed for long periods of time without moving your legs.  Maintain a weight that is appropriate for your height. Ask your health care provider what weight is healthy for you.  If you are a woman who is over 66 years of age, avoid unnecessary use of medicines that contain estrogen. These include birth control pills.  Do not smoke, especially if you take estrogen medicines. If you need help quitting, ask your health care provider. If you are hospitalized, prevention measures may include:  Early  walking after surgery, as soon as your health care provider says that it is safe.  Receiving anticoagulants to prevent blood clots.If you cannot take anticoagulants, other options may be available, such as wearing compression stockings or using different types of devices. SEEK IMMEDIATE MEDICAL CARE IF:  You have new or increased pain, swelling, or redness in an arm or leg.  You have numbness or tingling in an arm or leg.  You have shortness of breath while active or at rest.  You have chest pain.  You have a rapid or irregular heartbeat.  You feel light-headed or dizzy.  You cough up blood.  You notice blood in your vomit, bowel movement, or urine. These symptoms may represent a serious problem that is an emergency. Do not wait to see  if the symptoms will go away. Get medical help right away. Call your local emergency services (911 in the [place] Do not drive yourself to the hospital.   This information is not intended to replace advice given to you by your health care provider. Make sure you discuss any questions you have with your health care provider.   Document Released: 05/18/2005 Document Revised: 02/06/2015 Document Reviewed: 09/12/2014 Elsevier Interactive Patient Education [employer].

## 2015-08-25 ENCOUNTER — Emergency Department: Payer: Worker's Compensation

## 2015-08-25 ENCOUNTER — Inpatient Hospital Stay
Admission: EM | Admit: 2015-08-25 | Discharge: 2015-08-27 | DRG: 803 | Disposition: A | Discharge status: Home / Self Care | Payer: Worker's Compensation | Attending: Internal Medicine | Admitting: Internal Medicine

## 2015-08-25 ENCOUNTER — Inpatient Hospital Stay: Payer: Worker's Compensation

## 2015-08-25 ENCOUNTER — Encounter: Payer: Self-pay | Admitting: Emergency Medicine

## 2015-08-25 DIAGNOSIS — Y92009 Unspecified place in unspecified non-institutional (private) residence as the place of occurrence of the external cause: Secondary | ICD-10-CM | POA: Diagnosis not present

## 2015-08-25 DIAGNOSIS — E785 Hyperlipidemia, unspecified: Secondary | ICD-10-CM | POA: Diagnosis present

## 2015-08-25 DIAGNOSIS — K922 Gastrointestinal hemorrhage, unspecified: Secondary | ICD-10-CM | POA: Diagnosis present

## 2015-08-25 DIAGNOSIS — T45515A Adverse effect of anticoagulants, initial encounter: Secondary | ICD-10-CM | POA: Diagnosis present

## 2015-08-25 DIAGNOSIS — E039 Hypothyroidism, unspecified: Secondary | ICD-10-CM | POA: Diagnosis present

## 2015-08-25 DIAGNOSIS — F329 Major depressive disorder, single episode, unspecified: Secondary | ICD-10-CM | POA: Diagnosis present

## 2015-08-25 DIAGNOSIS — K921 Melena: Secondary | ICD-10-CM | POA: Diagnosis present

## 2015-08-25 DIAGNOSIS — I10 Essential (primary) hypertension: Secondary | ICD-10-CM | POA: Diagnosis present

## 2015-08-25 DIAGNOSIS — F419 Anxiety disorder, unspecified: Secondary | ICD-10-CM | POA: Diagnosis present

## 2015-08-25 DIAGNOSIS — I82403 Acute embolism and thrombosis of unspecified deep veins of lower extremity, bilateral: Secondary | ICD-10-CM | POA: Diagnosis present

## 2015-08-25 DIAGNOSIS — Z8249 Family history of ischemic heart disease and other diseases of the circulatory system: Secondary | ICD-10-CM

## 2015-08-25 DIAGNOSIS — N950 Postmenopausal bleeding: Secondary | ICD-10-CM | POA: Diagnosis present

## 2015-08-25 DIAGNOSIS — E871 Hypo-osmolality and hyponatremia: Secondary | ICD-10-CM | POA: Diagnosis present

## 2015-08-25 DIAGNOSIS — D649 Anemia, unspecified: Secondary | ICD-10-CM | POA: Diagnosis present

## 2015-08-25 DIAGNOSIS — D62 Acute posthemorrhagic anemia: Secondary | ICD-10-CM | POA: Diagnosis present

## 2015-08-25 DIAGNOSIS — D252 Subserosal leiomyoma of uterus: Secondary | ICD-10-CM | POA: Diagnosis present

## 2015-08-25 DIAGNOSIS — Z7901 Long term (current) use of anticoagulants: Secondary | ICD-10-CM

## 2015-08-25 DIAGNOSIS — N898 Other specified noninflammatory disorders of vagina: Secondary | ICD-10-CM | POA: Diagnosis not present

## 2015-08-25 DIAGNOSIS — D6832 Hemorrhagic disorder due to extrinsic circulating anticoagulants: Secondary | ICD-10-CM | POA: Diagnosis present

## 2015-08-25 DIAGNOSIS — R0602 Shortness of breath: Secondary | ICD-10-CM

## 2015-08-25 HISTORY — DX: Acute embolism and thrombosis of unspecified deep veins of unspecified lower extremity: I82.409

## 2015-08-25 LAB — CBC
HEMATOCRIT: 16.3 % — AB (ref 35.0–47.0)
HEMOGLOBIN: 5.2 g/dL — AB (ref 12.0–16.0)
MCH: 29.9 pg (ref 26.0–34.0)
MCHC: 31.7 g/dL — ABNORMAL LOW (ref 32.0–36.0)
MCV: 94.2 fL (ref 80.0–100.0)
Platelets: 415 10*3/uL (ref 150–440)
RBC: 1.73 MIL/uL — AB (ref 3.80–5.20)
RDW: 15.6 % — ABNORMAL HIGH (ref 11.5–14.5)
WBC: 4.7 10*3/uL (ref 3.6–11.0)

## 2015-08-25 LAB — APTT: aPTT: 31 seconds (ref 24–36)

## 2015-08-25 LAB — COMPREHENSIVE METABOLIC PANEL
ALBUMIN: 3.2 g/dL — AB (ref 3.5–5.0)
ALT: 21 U/L (ref 14–54)
ANION GAP: 10 (ref 5–15)
AST: 23 U/L (ref 15–41)
Alkaline Phosphatase: 51 U/L (ref 38–126)
BILIRUBIN TOTAL: 0.5 mg/dL (ref 0.3–1.2)
BUN: 11 mg/dL (ref 6–20)
CHLORIDE: 103 mmol/L (ref 101–111)
CO2: 20 mmol/L — ABNORMAL LOW (ref 22–32)
Calcium: 8.3 mg/dL — ABNORMAL LOW (ref 8.9–10.3)
Creatinine, Ser: 0.63 mg/dL (ref 0.44–1.00)
GFR calc Af Amer: >60 mL/min (ref >60)
Glucose, Bld: 107 mg/dL — ABNORMAL HIGH (ref 65–99)
POTASSIUM: 3.5 mmol/L (ref 3.5–5.1)
Sodium: 133 mmol/L — ABNORMAL LOW (ref 135–145)
TOTAL PROTEIN: 6.2 g/dL — AB (ref 6.5–8.1)

## 2015-08-25 LAB — PROTIME-INR
INR: 1.43
PROTHROMBIN TIME: 17.5 s — AB (ref 11.4–15.0)

## 2015-08-25 LAB — ABO/RH: ABO/RH(D): A NEG

## 2015-08-25 LAB — MAGNESIUM: MAGNESIUM: 1.8 mg/dL (ref 1.7–2.4)

## 2015-08-25 LAB — PREPARE RBC (CROSSMATCH)

## 2015-08-25 MED ORDER — LORAZEPAM 2 MG/ML IJ SOLN
INTRAMUSCULAR | Status: AC
  Administered 2015-08-25: 0.5 mg
  Filled 2015-08-25: qty 1

## 2015-08-25 MED ORDER — ALPRAZOLAM 0.5 MG PO TABS
0.5000 mg | ORAL_TABLET | Freq: Two times a day (BID) | ORAL | Status: DC | PRN
  Administered 2015-08-26: 0.5 mg via ORAL
  Filled 2015-08-25: qty 1

## 2015-08-25 MED ORDER — DIAZEPAM 5 MG PO TABS
5.0000 mg | ORAL_TABLET | Freq: Four times a day (QID) | ORAL | Status: DC | PRN
Start: 2015-08-25 — End: 2015-08-27

## 2015-08-25 MED ORDER — DIPHENHYDRAMINE HCL 50 MG/ML IJ SOLN
INTRAMUSCULAR | Status: AC
  Filled 2015-08-25: qty 1

## 2015-08-25 MED ORDER — SODIUM CHLORIDE 0.9 % IV SOLN
10.0000 mL/h | Freq: Once | INTRAVENOUS | Status: AC
  Administered 2015-08-25: 10 mL/h via INTRAVENOUS

## 2015-08-25 MED ORDER — PANTOPRAZOLE SODIUM 40 MG IV SOLR
40.0000 mg | Freq: Once | INTRAVENOUS | Status: AC
  Administered 2015-08-25: 40 mg via INTRAVENOUS
  Filled 2015-08-25: qty 40

## 2015-08-25 MED ORDER — ACETAMINOPHEN 650 MG RE SUPP
650.0000 mg | Freq: Four times a day (QID) | RECTAL | Status: DC | PRN
  Filled 2015-08-25: qty 1

## 2015-08-25 MED ORDER — ACETAMINOPHEN 325 MG PO TABS: 650.0000 mg | ORAL_TABLET | Freq: Four times a day (QID) | ORAL | Status: DC | PRN

## 2015-08-25 MED ORDER — PANTOPRAZOLE SODIUM 40 MG IV SOLR
8.0000 mg/h | INTRAVENOUS | Status: DC
  Administered 2015-08-25 – 2015-08-26 (×2): 8 mg/h via INTRAVENOUS
  Filled 2015-08-25 (×2): qty 80

## 2015-08-25 MED ORDER — PANTOPRAZOLE SODIUM 40 MG IV SOLR
40.0000 mg | Freq: Once | INTRAVENOUS | Status: DC
  Filled 2015-08-25: qty 40

## 2015-08-25 MED ORDER — SODIUM CHLORIDE 0.9 % IV SOLN
80.0000 mg | Freq: Once | INTRAVENOUS | Status: AC
  Administered 2015-08-25: 21:00:00 80 mg via INTRAVENOUS
  Filled 2015-08-25: qty 80

## 2015-08-25 MED ORDER — ONDANSETRON HCL 4 MG PO TABS
4.0000 mg | ORAL_TABLET | Freq: Four times a day (QID) | ORAL | Status: DC | PRN
  Filled 2015-08-25: qty 1

## 2015-08-25 MED ORDER — CITALOPRAM HYDROBROMIDE 20 MG PO TABS
20.0000 mg | ORAL_TABLET | Freq: Every morning | ORAL | Status: DC
  Administered 2015-08-26 – 2015-08-27 (×2): 20 mg via ORAL
  Filled 2015-08-25 (×2): qty 1

## 2015-08-25 MED ORDER — HYDROCODONE-ACETAMINOPHEN 5-325 MG PO TABS: 1.0000 | ORAL_TABLET | Freq: Four times a day (QID) | ORAL | Status: DC | PRN

## 2015-08-25 MED ORDER — PANTOPRAZOLE SODIUM 40 MG IV SOLR: 40.0000 mg | Freq: Two times a day (BID) | INTRAVENOUS | Status: DC

## 2015-08-25 MED ORDER — DIPHENHYDRAMINE HCL 50 MG/ML IJ SOLN
25.0000 mg | Freq: Once | INTRAMUSCULAR | Status: AC
  Administered 2015-08-25: 25 mg via INTRAVENOUS

## 2015-08-25 MED ORDER — ALBUTEROL SULFATE (2.5 MG/3ML) 0.083% IN NEBU
2.5000 mg | INHALATION_SOLUTION | RESPIRATORY_TRACT | Status: DC | PRN
  Filled 2015-08-25: qty 3

## 2015-08-25 MED ORDER — LORAZEPAM 2 MG/ML IJ SOLN: 0.5000 mg | Freq: Once | INTRAMUSCULAR | Status: DC

## 2015-08-25 MED ORDER — POTASSIUM CHLORIDE IN NACL 20-0.9 MEQ/L-% IV SOLN
INTRAVENOUS | Status: DC
  Administered 2015-08-26 – 2015-08-27 (×4): via INTRAVENOUS
  Filled 2015-08-25 (×8): qty 1000

## 2015-08-25 MED ORDER — ONDANSETRON HCL 4 MG/2ML IJ SOLN: 4.0000 mg | Freq: Four times a day (QID) | INTRAMUSCULAR | Status: DC | PRN

## 2015-08-25 MED ORDER — ZOLPIDEM TARTRATE 5 MG PO TABS
5.0000 mg | ORAL_TABLET | Freq: Every day | ORAL | Status: DC
  Administered 2015-08-26: 20:00:00 5 mg via ORAL
  Filled 2015-08-25: qty 1

## 2015-08-25 MED ORDER — FLUTICASONE PROPIONATE 50 MCG/ACT NA SUSP
1.0000 | NASAL | Status: DC | PRN
  Filled 2015-08-25: qty 16

## 2015-08-25 MED ORDER — BUSPIRONE HCL 10 MG PO TABS
10.0000 mg | ORAL_TABLET | Freq: Two times a day (BID) | ORAL | Status: DC
  Administered 2015-08-26 – 2015-08-27 (×2): 10 mg via ORAL
  Filled 2015-08-25 (×3): qty 1

## 2015-08-25 MED ORDER — ZOLPIDEM TARTRATE 5 MG PO TABS: 10.0000 mg | ORAL_TABLET | Freq: Every day | ORAL | Status: DC

## 2015-08-25 MED ORDER — LEVOTHYROXINE SODIUM 50 MCG PO TABS
50.0000 ug | ORAL_TABLET | Freq: Every day | ORAL | Status: DC
  Administered 2015-08-26 – 2015-08-27 (×2): 50 ug via ORAL
  Filled 2015-08-25 (×2): qty 1

## 2015-08-25 MED ORDER — FERROUS SULFATE 325 (65 FE) MG PO TABS
325.0000 mg | ORAL_TABLET | Freq: Every day | ORAL | Status: DC
  Administered 2015-08-25 – 2015-08-27 (×3): 325 mg via ORAL
  Filled 2015-08-25 (×3): qty 1

## 2015-08-25 NOTE — Progress Notes (Signed)
Alert and oriented, admitted from ED, hgb of 5.2, 2 units of blood ordered, 1 infusing that was started in ED, pt notes that she feels better, vital signs stable, on room air, awaiting on protonix drip and maintenance fludis from ED.

## 2015-08-25 NOTE — H&P (Addendum)
[facility] [place] NAME: Ashley Hurst    MR#:  [id number]  DATE OF BIRTH:  February 24, 1961  DATE OF ADMISSION:  08/25/2015  PRIMARY CARE PHYSICIAN: Pcp Not In System   REQUESTING/REFERRING PHYSICIAN: Daymon Larsen, MD  CHIEF COMPLAINT:   Chief Complaint  Patient presents with  . Shortness of Breath  Vaginal bleeding. shortness of breath and syncope today.  HISTORY OF PRESENT ILLNESS:  Ashley Hurst  is a 55 y.o. female with a known history of I per tension, hyperlipidemia and recent bilateral leg DVT. The patient has recent history of back surgery with abdominal approach. She has had abdominal pain since the surgery. She also has bilateral leg DVT after surgery and is on xarelto since January. She had 2 episodes of vaginal bleeding last week. She also complains of melena. She has a lot of vaginal bleeding today but she is not sure if she has rectal bleeding. She complains of shortness breath and a brief syncopal episode today. Her hemoglobin is 5.2 in the ED. Dr. Marcelene Butte ordered 2 units of PRBC transfusion and give Protonix IV 1 dose. The patient denies any other symptoms except abdominal pain since surgery.  PAST MEDICAL HISTORY:   Past Medical History  Diagnosis Date  . Hypertension   . Hyperlipidemia   . Depression   . Hypothyroidism   . Anxiety   . Shortness of breath dyspnea   . DVT (deep venous thrombosis) ([place])     PAST SURGICAL HISTORY:   Past Surgical History  Procedure Laterality Date  . Knee surgery Left 1993  . Hernia repair    . Anterior lumbar disc arthroplasty N/A 06/04/2015    Procedure: L4-5 L5-S1 Lumbar artificial disc replacement with Dr. Scot Dock for approach;  Surgeon: Kristeen Miss, MD;  Location: [facility] NEURO ORS;  Service: Neurosurgery;  Laterality: N/A;  L4-5 L5-S1 Lumbar artificial disc replacement with Dr. Scot Dock for approach  . Abdominal exposure N/A 06/04/2015    Procedure: ABDOMINAL EXPOSURE;  Surgeon:  Angelia Mould, MD;  Location: [facility] NEURO ORS;  Service: Vascular;  Laterality: N/A;  ABDOMINAL EXPOSURE  . Back surgery      SOCIAL HISTORY:   Social History  Substance Use Topics  . Smoking status: Never Smoker   . Smokeless tobacco: Never Used  . Alcohol Use: Yes     Comment: [email or web address]    FAMILY HISTORY:   Family History  Problem Relation Age of Onset  . Hypertension Mother     DRUG ALLERGIES:  No Known Allergies  REVIEW OF SYSTEMS:  CONSTITUTIONAL: No fever, But has generalized weakness.  EYES: No blurred or double vision.  EARS, NOSE, AND THROAT: No tinnitus or ear pain.  RESPIRATORY: No cough, but has shortness of breath, no wheezing or hemoptysis.  CARDIOVASCULAR: No chest pain, orthopnea, edema.  GASTROINTESTINAL: No nausea, vomiting, diarrhea but has abdominal pain.  GENITOURINARY: No dysuria, hematuria.  ENDOCRINE: No polyuria, nocturia,  HEMATOLOGY: Has anemia and bleeding SKIN: No rash or lesion. MUSCULOSKELETAL: No joint pain or arthritis.   NEUROLOGIC: No tingling, numbness, weakness.  PSYCHIATRY: No anxiety or depression.   MEDICATIONS AT HOME:   Prior to Admission medications   Medication Sig Start Date End Date Taking? Authorizing Provider  ALPRAZolam Duanne Moron) 1 MG tablet Take 0.5 mg by mouth 2 (two) times daily as needed for anxiety. No frequency of medications is noted in list provided by Dr Landis Gandy  [facility]  Yes Historical Provider, MD  aspirin EC 81 MG tablet Take 81 mg by mouth every morning.   Yes Historical Provider, MD  busPIRone (BUSPAR) 10 MG tablet Take 10 mg by mouth 2 (two) times daily.   Yes Historical Provider, MD  citalopram (CELEXA) 20 MG tablet Take 20 mg by mouth every morning. 05/02/15  Yes Historical Provider, MD  cyanocobalamin 1000 MCG tablet Take 1,000 mcg by mouth daily.   Yes Historical Provider, MD  diazepam (VALIUM) 5 MG tablet Take 1 tablet (5 mg total) by mouth every 6 (six) hours as needed for  muscle spasms. 06/07/15  Yes Kristeen Miss, MD  diclofenac (VOLTAREN) 75 MG EC tablet Take 75 mg by mouth 2 (two) times daily. 08/01/15  Yes Historical Provider, MD  estradiol (ESTRACE) 1 MG tablet Take 1 mg by mouth daily.    Yes Historical Provider, MD  ferrous sulfate 325 (65 FE) MG tablet Take 325 mg by mouth daily. 03/08/15  Yes Historical Provider, MD  fluticasone (FLONASE) 50 MCG/ACT nasal spray Place 1 spray into both nostrils as needed for allergies.    Yes Historical Provider, MD  Hydrocodone-Acetaminophen 5-300 MG TABS Take 1 tablet by mouth every 6 (six) hours as needed. for pain 08/01/15  Yes Historical Provider, MD  levothyroxine (SYNTHROID, LEVOTHROID) 50 MCG tablet Take 50 mcg by mouth daily before breakfast.   Yes Historical Provider, MD  lisinopril-hydrochlorothiazide (PRINZIDE,ZESTORETIC) 20-25 MG tablet Take 1 tablet by mouth daily.    Yes Historical Provider, MD  medroxyPROGESTERone (PROVERA) 2.5 MG tablet Take 2.5 mg by mouth daily. 07/31/15  Yes Historical Provider, MD  Multiple Vitamins-Minerals (COMPLETE MULTIVITAMIN/MINERAL PO) Take 1 tablet by mouth every morning.   Yes Historical Provider, MD  Omega-3 Fatty Acids (FISH OIL) 600 MG CAPS Take 600 mg by mouth daily.   Yes Historical Provider, MD  oxyCODONE-acetaminophen (PERCOCET/ROXICET) 5-325 MG tablet Take 1-2 tablets by mouth every 4 (four) hours as needed for severe pain. 06/07/15  Yes Kristeen Miss, MD  XARELTO 20 MG TABS tablet Take 20 mg by mouth daily with supper. 07/31/15  Yes Historical Provider, MD  zolpidem (AMBIEN) 10 MG tablet Take 10 mg by mouth at bedtime.    Yes Historical Provider, MD      VITAL SIGNS:  Blood pressure 105/64, pulse 80, temperature 98.4 F (36.9 C), temperature source Oral, resp. rate 16, height 5' 5.5" (1.664 m), weight 61.236 kg (135 lb), last menstrual period 08/23/2015, SpO2 100 %.  PHYSICAL EXAMINATION:  GENERAL:  55 y.o.-year-old patient lying in the bed with no acute distress.  EYES: Pupils  equal, round, reactive to light and accommodation. No scleral icterus. Extraocular muscles intact.  HEENT: Head atraumatic, normocephalic. Oropharynx and nasopharynx clear.  NECK:  Supple, no jugular venous distention. No thyroid enlargement, no tenderness.  LUNGS: Normal breath sounds bilaterally, no wheezing, rales,rhonchi or crepitation. No use of accessory muscles of respiration.  CARDIOVASCULAR: S1, S2 normal. No murmurs, rubs, or gallops.  ABDOMEN: Soft, Diffuse abdominal tenderness, nondistended. Bowel sounds present. No organomegaly or mass.  EXTREMITIES: No pedal edema, cyanosis, or clubbing.  NEUROLOGIC: Cranial nerves II through XII are intact. Muscle strength 5/5 in all extremities. Sensation intact. Gait not checked.  PSYCHIATRIC: The patient is alert and oriented x 3.  SKIN: No obvious rash, lesion, or ulcer. No bruises.  LABORATORY PANEL:   CBC  Recent Labs Lab 08/25/15 1321  WBC 4.7  HGB 5.2*  HCT 16.3*  PLT 415   ------------------------------------------------------------------------------------------------------------------  Chemistries  Recent Labs Lab 08/25/15 1321  NA 133*  K 3.5  CL 103  CO2 20*  GLUCOSE 107*  BUN 11  CREATININE 0.63  CALCIUM 8.3*  AST 23  ALT 21  ALKPHOS 51  BILITOT 0.5   ------------------------------------------------------------------------------------------------------------------  Cardiac Enzymes No results for input(s): TROPONINI in the last 168 hours. ------------------------------------------------------------------------------------------------------------------  RADIOLOGY:  Dg Chest 2 View  08/25/2015  CLINICAL DATA:  55 year old female with shortness of breath since yesterday. Spine surgery in January. Initial encounter. EXAM: CHEST  2 VIEW COMPARISON:  No prior chest radiographs. FINDINGS: Lung volumes are within normal limits. Increased interstitial markings bilaterally. Cardiac size at the upper limits of  normal. Other mediastinal contours are within normal limits. Visualized tracheal air column is within normal limits. No pneumothorax, pleural effusion or confluent pulmonary opacity. Mild scoliosis. No acute osseous abnormality identified. IMPRESSION: Nonspecific increased pulmonary interstitial markings, might be chronic but consider viral/atypical respiratory infection. No pleural effusion or other acute cardiopulmonary abnormality identified. Electronically Signed   By: Genevie Ann M.D.   On: 08/25/2015 14:18    EKG:   Orders placed or performed during the hospital encounter of 08/25/15  . EKG 12-Lead  . EKG 12-Lead  . EKG 12-Lead  . EKG 12-Lead  . ED EKG  . ED EKG  . ED EKG  . ED EKG    IMPRESSION AND PLAN:   Symptomatic anemia PRBC transfusion 2 units for now, follow-up hemoglobin every 6 hours. Hold xarelto.  GI bleeding.  Hold xarelto. Protonix IV and effusion, GI consult and follow-up hemoglobin.  Menorrhagia. OB/GYN consult.  Hyponatremia. Normal saline IV and follow-up BMP.  Bilateral leg DVT.  Hold xarelto due to bleeding. Hypertension. Blood pressure is in low side, hold lisinopril-HCTZ.  I discussed with on-call GI and OB/GYN physician. All the records are reviewed and case discussed with ED provider. Management plans discussed with the patient, family and they are in agreement.  CODE STATUS: Full code  TOTAL TIME TAKING CARE OF THIS PATIENT: 56 minutes.    Demetrios Loll M.D on 08/25/2015 at 5:05 PM  Between 7am to 6pm - Pager - [phone]  After 6pm go to [email or web address] - password [email or web address] [facility]  Office  [phone]  CC: Primary care physician; Pcp Not In System

## 2015-08-25 NOTE — Consult Note (Signed)
GI Consultation Note  Referring Provider: Demetrios Loll, MD Date of Consult: 08/25/2015  HPI: Ashley Hurst is a 55 y.o. female being seen in consultation at the request of Demetrios Loll, MD for anemia.  Ashley Hurst is a 54 y.o. female woman with recent back surgery and a DVT. She subsequently has developed black stools over the past month. However over the past 48 hours she has developed passage of clots and large amounts of bright red blood in her bed.  She was not sure where this was coming from as she has not had her menstrual period in some time, and was concerned initially that it was coming from her rectum. However she continued to pass large amounts of blood. She has only had one BM in the past 3 days, which was small volume and firm. She had some pain in the rectum at that time due to straining, but no bleeding.  She passed out at home prompting her to come to the ED for further evaluation.   She takes Xarelto for a recent DVT, but she denies use of NSAIDs. Her appetite has been poor recently. She also notes that her energy level is poor.   PMH: Past Medical History  Diagnosis Date  . Hypertension   . Hyperlipidemia   . Depression   . Hypothyroidism   . Anxiety   . Shortness of breath dyspnea   . DVT (deep venous thrombosis) (HCC)     PSH: Past Surgical History  Procedure Laterality Date  . Knee surgery Left 1993  . Hernia repair    . Anterior lumbar disc arthroplasty N/A 06/04/2015    Procedure: L4-5 L5-S1 Lumbar artificial disc replacement with Dr. Scot Dock for approach;  Surgeon: Kristeen Miss, MD;  Location: [facility] NEURO ORS;  Service: Neurosurgery;  Laterality: N/A;  L4-5 L5-S1 Lumbar artificial disc replacement with Dr. Scot Dock for approach  . Abdominal exposure N/A 06/04/2015    Procedure: ABDOMINAL EXPOSURE;  Surgeon: Angelia Mould, MD;  Location: [facility] NEURO ORS;  Service: Vascular;  Laterality: N/A;  ABDOMINAL EXPOSURE  . Back surgery      Family History: Family  History  Problem Relation Age of Onset  . Hypertension Mother     Social History: Social History   Social History  . Marital Status: Single    Spouse Name: N/A  . Number of Children: N/A  . Years of Education: N/A   Occupational History  . Not on file.   Social History Main Topics  . Smoking status: Never Smoker   . Smokeless tobacco: Never Used  . Alcohol Use: Yes     Comment: [email or web address]  . Drug Use: No  . Sexual Activity: Not on file   Other Topics Concern  . Not on file   Social History Narrative    ROS: The balance of a 12 system review is negative other than as described in the HPI.  SCHEDULED MEDS: . busPIRone  10 mg Oral BID  . [START ON 08/26/2015] citalopram  20 mg Oral q morning - 10a  . ferrous sulfate  325 mg Oral Daily  . [START ON 08/26/2015] levothyroxine  50 mcg Oral QAC breakfast  . pantoprazole (PROTONIX) IV  40 mg Intravenous Once  . zolpidem  10 mg Oral QHS    PHYSICAL EXAM: Filed Vitals:   08/25/15 1716 08/25/15 1731  BP: 106/71 107/63  Pulse: 81 78  Temp: 98.1 F (36.7 C) 98 F (36.7 C)  Resp: 16 18  GEN: Alert, oriented x3 in no apparent distress. HEENT: Oropharynx clear. Anicteric CV: Nl rate, nl rhythm. No murmurs, rubs or gallops. LUNGS: Clear to auscultation bilaterally. No wheezes, rales or rhonchi. ABD: Bowel sounds present. Abdomen soft, tender diffusely, nondistended. RECTAL: nontender, brown stool in vault.  GU: Moderate amounts of red blood coming from the vagina soaking a diaper. No further vaginal examination performed. EXT: no edema NEURO: no focal neurologic deficits.  LABS: CBC Latest Ref Rng 08/25/2015 06/17/2015 06/05/2015  WBC 3.6 - 11.0 K/uL 4.7 7.2 9.3  Hemoglobin 12.0 - 16.0 g/dL 5.2(L) 10.3(L) 7.2(L)  Hematocrit 35.0 - 47.0 % 16.3(L) 31.4(L) 23.1(L)  Platelets 150 - 440 K/uL 415 352 133(L)    BMP Latest Ref Rng 08/25/2015 06/17/2015 06/05/2015  Glucose 65 - 99 mg/dL 107(H) 75 135(H)  BUN 6 - 20 mg/dL 11 11  <5(L)  Creatinine 0.44 - 1.00 mg/dL 0.63 0.73 0.67  Sodium 135 - 145 mmol/L 133(L) 140 137  Potassium 3.5 - 5.1 mmol/L 3.5 4.1 4.1  Chloride 101 - 111 mmol/L 103 104 107  CO2 22 - 32 mmol/L 20(L) 26 24  Calcium 8.9 - 10.3 mg/dL 8.3(L) 8.8(L) 7.1(L)    Hepatic Function Latest Ref Rng 08/25/2015  Total Protein 6.5 - 8.1 g/dL 6.2(L)  Albumin 3.5 - 5.0 g/dL 3.2(L)  AST 15 - 41 U/L 23  ALT 14 - 54 U/L 21  Alk Phosphatase 38 - 126 U/L 51  Total Bilirubin 0.3 - 1.2 mg/dL 0.5    ASSESSMENT: Ashley Hurst is a 55 y.o. female presenting with symptomatic anemia. Although she reports dark black stools for the past 6-8 weeks, the more acute bleeding that she is having appears to be coming from her vagina. As such, I would recommend urgent GYN consult for evaluation of vaginal source of bleeding. She may ultimately need an evaluation with EGD +/- colonoscopy for chronic black stools, but the more urgent bleeding appears to be gynecologic in etiology. She expressed understanding and these recommendations were communicated directly to Dr. Bridgett Larsson.  RECOMMENDATIONS: - GYN consultation for evaluation of vaginal bleeding - consider EGD +/- colonoscopy as necessary after vaginal bleeding evaluated - continue to monitor hemodynamics closely  Gay Moncivais L. Drema Dallas, MD, MPH

## 2015-08-25 NOTE — ED Notes (Addendum)
Patient presents to the ED with shortness of breath x 2 days with a syncopal episode today.  Patient reports heavy vaginal bleeding x 1 week and history of DVTs in her legs post surgery.  Patient is currently on xarelto.  Patient reports cough, congestion x 3 weeks.  Patient reports another syncopal episode this week.  Patient's sister was with her when she passed out today, sister states patient did not hit her head and was only unconscious for a "split second".  Patient reports dark stools since starting the xarelto.

## 2015-08-25 NOTE — ED Provider Notes (Signed)
Time Seen: Approximately 1500  I have reviewed the triage notes  Chief Complaint: Shortness of Breath   History of Present Illness: Ashley Hurst is a 55 y.o. female who presents with recent history of back surgery with a abdominal approach. Patient developed postoperative DVTs in both lower extremities and was started on several toe. Over the last 2 days she's had some vaginal bleeding and has felt short of breath and had a brief syncopal episode today. States the vaginal bleeding is currently over the past week and she has a history of irregular menstrual periods. She denies any risk of being pregnant. She's had some intermittent diffuse abdominal pain that was felt to be postoperative discomfort. She denies any fever or increased back or flank pain. She has had some feelings of shortness of breath and fatigue. Past Medical History  Diagnosis Date  . Hypertension   . Hyperlipidemia   . Depression   . Hypothyroidism   . Anxiety   . Shortness of breath dyspnea   . DVT (deep venous thrombosis) [facility])     Patient Active Problem List   Diagnosis Date Noted  . Acute bilateral deep vein thrombosis (DVT) of femoral veins ([place]) 06/17/2015  . Essential hypertension 06/17/2015  . Lumbar radiculopathy 06/04/2015    Past Surgical History  Procedure Laterality Date  . Knee surgery Left 1993  . Hernia repair    . Anterior lumbar disc arthroplasty N/A 06/04/2015    Procedure: L4-5 L5-S1 Lumbar artificial disc replacement with Dr. Scot Dock for approach;  Surgeon: Kristeen Miss, MD;  Location: [facility] NEURO ORS;  Service: Neurosurgery;  Laterality: N/A;  L4-5 L5-S1 Lumbar artificial disc replacement with Dr. Scot Dock for approach  . Abdominal exposure N/A 06/04/2015    Procedure: ABDOMINAL EXPOSURE;  Surgeon: Angelia Mould, MD;  Location: [facility] NEURO ORS;  Service: Vascular;  Laterality: N/A;  ABDOMINAL EXPOSURE  . Back surgery      Past Surgical History  Procedure Laterality Date  . Knee  surgery Left 1993  . Hernia repair    . Anterior lumbar disc arthroplasty N/A 06/04/2015    Procedure: L4-5 L5-S1 Lumbar artificial disc replacement with Dr. Scot Dock for approach;  Surgeon: Kristeen Miss, MD;  Location: [facility] NEURO ORS;  Service: Neurosurgery;  Laterality: N/A;  L4-5 L5-S1 Lumbar artificial disc replacement with Dr. Scot Dock for approach  . Abdominal exposure N/A 06/04/2015    Procedure: ABDOMINAL EXPOSURE;  Surgeon: Angelia Mould, MD;  Location: [facility] NEURO ORS;  Service: Vascular;  Laterality: N/A;  ABDOMINAL EXPOSURE  . Back surgery      Current Outpatient Rx  Name  Route  Sig  Dispense  Refill  . ALPRAZolam (XANAX) 1 MG tablet   Oral   Take 0.5 mg by mouth 2 (two) times daily as needed for anxiety. No frequency of medications is noted in list provided by Dr Landis Gandy  [facility]         . aspirin EC 81 MG tablet   Oral   Take 81 mg by mouth every morning.         . busPIRone (BUSPAR) 10 MG tablet   Oral   Take 10 mg by mouth 2 (two) times daily.         . citalopram (CELEXA) 20 MG tablet   Oral   Take 20 mg by mouth every morning.      3   . cyanocobalamin 1000 MCG tablet   Oral   Take 1,000  mcg by mouth daily.         . diazepam (VALIUM) 5 MG tablet   Oral   Take 1 tablet (5 mg total) by mouth every 6 (six) hours as needed for muscle spasms.   60 tablet   0   . estradiol (ESTRACE) 1 MG tablet   Oral   Take 1 mg by mouth daily.          . ferrous sulfate 325 (65 FE) MG tablet   Oral   Take 325 mg by mouth daily.      1   . fluticasone (FLONASE) 50 MCG/ACT nasal spray   Each Nare   Place 1 spray into both nostrils as needed for allergies.          Marland Kitchen levothyroxine (SYNTHROID, LEVOTHROID) 50 MCG tablet   Oral   Take 50 mcg by mouth daily before breakfast.         . lisinopril-hydrochlorothiazide (PRINZIDE,ZESTORETIC) 20-25 MG tablet   Oral   Take 1 tablet by mouth daily.          . meloxicam (MOBIC) 15 MG  tablet   Oral   Take 15 mg by mouth daily.          . Multiple Vitamins-Minerals (COMPLETE MULTIVITAMIN/MINERAL PO)   Oral   Take 1 tablet by mouth every morning.         . Omega-3 Fatty Acids (FISH OIL) 600 MG CAPS   Oral   Take 600 mg by mouth daily.         Marland Kitchen oxyCODONE-acetaminophen (PERCOCET/ROXICET) 5-325 MG tablet   Oral   Take 1-2 tablets by mouth every 4 (four) hours as needed for severe pain.   60 tablet   0   . traMADol (ULTRAM) 50 MG tablet   Oral   Take 50 mg by mouth every 6 (six) hours as needed for moderate pain.       3   . zolpidem (AMBIEN) 10 MG tablet   Oral   Take 10 mg by mouth at bedtime.            Allergies:  Review of patient's allergies indicates no known allergies.  Family History: No family history on file.  Social History: Social History  Substance Use Topics  . Smoking status: Never Smoker   . Smokeless tobacco: Never Used  . Alcohol Use: Yes     Comment: [email or web address]     Review of Systems:   10 point review of systems was performed and was otherwise negative:  Constitutional: No fever Eyes: No visual disturbances ENT: No sore throat, ear pain Cardiac: No chest pain Respiratory:Shortness of breath especially with exertion Abdomen: Diffuse lower abdominal pain especially near the surgical region unchanged Endocrine: No weight loss, No night sweats Extremities: No peripheral edema, cyanosis Skin: No rashes, easy bruising Neurologic: No focal weakness, trouble with speech or swollowing Urologic: No dysuria, Hematuria, or urinary frequency   Physical Exam:  ED Triage Vitals  Enc Vitals Group     BP 08/25/15 1313 110/64 mmHg     Pulse Rate 08/25/15 1313 84     Resp 08/25/15 1313 22     Temp 08/25/15 1313 98.4 F (36.9 C)     Temp Source 08/25/15 1313 Oral     SpO2 08/25/15 1313 100 %     Weight 08/25/15 1313 135 lb (61.236 kg)     Height 08/25/15 1313 5' 5.5" (1.664 m)     Head Cir --  Peak Flow --      Pain  Score 08/25/15 1314 10     Pain Loc --      Pain Edu? --      Excl. in [place]? --     General: Awake , Alert , and Oriented times 3; GCS 15 Head: Normal cephalic , atraumatic Eyes: Pupils equal , round, reactive to light Nose/Throat: No nasal drainage, patent upper airway without erythema or exudate.  Neck: Supple, Full range of motion, No anterior adenopathy or palpable thyroid masses Lungs: Clear to ascultation without wheezes , rhonchi, or rales Heart: Regular rate, regular rhythm without murmurs , gallops , or rubs Abdomen: Diffuse tenderness around her surgical site without obvious peritoneal signs. Bowel sounds are diminished in all 4 quadrants.    Extremities: 2 plus symmetric pulses. No edema, clubbing or cyanosis Neurologic: normal ambulation, Motor symmetric without deficits, sensory intact Skin: warm, dry, no rashes Rectal exam is guaiac positive with somewhat dark appearing stool in the rectal vault. There was no palpable masses on rectal exam. No active hemorrhoidal bleeding. Rectal exam performed with chaperone present  Labs:   All laboratory work was reviewed including any pertinent negatives or positives listed below:  Labs Reviewed  COMPREHENSIVE METABOLIC PANEL - Abnormal; Notable for the following:    Sodium 133 (*)    CO2 20 (*)    Glucose, Bld 107 (*)    Calcium 8.3 (*)    Total Protein 6.2 (*)    Albumin 3.2 (*)    All other components within normal limits  CBC - Abnormal; Notable for the following:    RBC 1.73 (*)    Hemoglobin 5.2 (*)    HCT 16.3 (*)    MCHC 31.7 (*)    RDW 15.6 (*)    All other components within normal limits  PROTIME-INR  APTT  TYPE AND SCREEN  ABO/RH  PREPARE RBC (CROSSMATCH)  Review of laboratory work shows significant anemia with a hemoglobin of 5.2  EKG: ED ECG REPORT I, Daymon Larsen, the attending physician, personally viewed and interpreted this ECG.  Date: 08/25/2015 EKG Time: 1305 Rate: *79 Rhythm: normal sinus  rhythm QRS Axis: normal Intervals: normal ST/T Wave abnormalities: normal Conduction Disturbances: none Narrative Interpretation: unremarkableCLINICAL DATA: 55 year old female with shortness of breath since yesterday. Spine surgery in January. Initial encounter.  Radiology  EXAM: CHEST 2 VIEW  COMPARISON: No prior chest radiographs.  FINDINGS: Lung volumes are within normal limits. Increased interstitial markings bilaterally. Cardiac size at the upper limits of normal. Other mediastinal contours are within normal limits. Visualized tracheal air column is within normal limits. No pneumothorax, pleural effusion or confluent pulmonary opacity. Mild scoliosis. No acute osseous abnormality identified.  IMPRESSION: Nonspecific increased pulmonary interstitial markings, might be chronic but consider viral/atypical respiratory infection. No pleural effusion or other acute cardiopulmonary abnormality identified.   Electronically Signed By: Genevie Ann M.D. On: 08/25/2015 14:18 No acute ischemic changes   Radiology:  I personally reviewed the radiologic studies     Critical Care: * CRITICAL CARE Performed by: Daymon Larsen   Total critical care time: 35 minutes  Critical care time was exclusive of separately billable procedures and treating other patients.  Critical care was necessary to treat or prevent imminent or life-threatening deterioration.  Critical care was time spent personally by me on the following activities: development of treatment plan with patient and/or surrogate as well as nursing, discussions with consultants, evaluation of patient's response to treatment, examination of  patient, obtaining history from patient or surrogate, ordering and performing treatments and interventions, ordering and review of laboratory studies, ordering and review of radiographic studies, pulse oximetry and re-evaluation of patient's condition. Initiation of workup for  anemia with blood transfusion therapy   ED Course: * Patient's stay was uneventful . She is currently hemodynamically stable. Of concern of course is her anemia with being on [place] all toe. She has not had fevers or also today and we will continue to hold. She was initiated for blood transfusion which I felt would help her symptomatically. As far as the source of her bleeding likely the vaginal bleeding will slow and I felt immediate OB/GYN consult was not necessary. The patient does have guaiac positive stool and concern would be for upper GI bleed and she was started on protonix.    Assessment:  Symptomatic Anemia Anticoagulation therapy Blood transfusion Possible upper gastrointestinal bleed Vaginal bleeding    Final Clinical Impression:  Final diagnoses:  Anemia due to acute blood loss     Plan:  Inpatient management            Daymon Larsen, MD 08/25/15 1614

## 2015-08-26 ENCOUNTER — Inpatient Hospital Stay: Payer: Worker's Compensation

## 2015-08-26 DIAGNOSIS — N898 Other specified noninflammatory disorders of vagina: Secondary | ICD-10-CM

## 2015-08-26 LAB — CBC
HCT: 23.6 % — ABNORMAL LOW (ref 35.0–47.0)
HEMOGLOBIN: 7.8 g/dL — AB (ref 12.0–16.0)
MCH: 29.1 pg (ref 26.0–34.0)
MCHC: 33 g/dL (ref 32.0–36.0)
MCV: 88.1 fL (ref 80.0–100.0)
PLATELETS: 330 10*3/uL (ref 150–440)
RBC: 2.68 MIL/uL — ABNORMAL LOW (ref 3.80–5.20)
RDW: 16.8 % — ABNORMAL HIGH (ref 11.5–14.5)
WBC: 4.5 10*3/uL (ref 3.6–11.0)

## 2015-08-26 LAB — BASIC METABOLIC PANEL
Anion gap: 2 — ABNORMAL LOW (ref 5–15)
BUN: 6 mg/dL (ref 6–20)
CALCIUM: 8 mg/dL — AB (ref 8.9–10.3)
CHLORIDE: 112 mmol/L — AB (ref 101–111)
CO2: 24 mmol/L (ref 22–32)
CREATININE: 0.63 mg/dL (ref 0.44–1.00)
GFR calc Af Amer: >60 mL/min (ref >60)
GFR calc non Af Amer: >60 mL/min (ref >60)
GLUCOSE: 86 mg/dL (ref 65–99)
Potassium: 4 mmol/L (ref 3.5–5.1)
Sodium: 138 mmol/L (ref 135–145)

## 2015-08-26 LAB — HEMOGLOBIN
HEMOGLOBIN: 7.7 g/dL — AB (ref 12.0–16.0)
Hemoglobin: 7.6 g/dL — ABNORMAL LOW (ref 12.0–16.0)

## 2015-08-26 LAB — MRSA PCR SCREENING: MRSA BY PCR: NEGATIVE

## 2015-08-26 MED ORDER — PANTOPRAZOLE SODIUM 40 MG IV SOLR
40.0000 mg | Freq: Two times a day (BID) | INTRAVENOUS | Status: DC
  Administered 2015-08-26 – 2015-08-27 (×3): 40 mg via INTRAVENOUS
  Filled 2015-08-26 (×2): qty 40

## 2015-08-26 MED ORDER — MEDROXYPROGESTERONE ACETATE 10 MG PO TABS
10.0000 mg | ORAL_TABLET | Freq: Every day | ORAL | Status: DC
Start: 2015-08-26 — End: 2015-08-27
  Administered 2015-08-26 – 2015-08-27 (×2): 10 mg via ORAL
  Filled 2015-08-26 (×4): qty 1

## 2015-08-26 NOTE — Progress Notes (Addendum)
At 2242, pt complained of SOB and become very anxious while receiving pRBC,  Blood stopped and MD notified, vital signs stable, 25 mg of benadryl IV given and Ativan 0.5 IV given per MD order.  MD on floor, Chest x-ray ordered per MD. Pt anxiety improved, chest x-ray reviewed by MD, Okay to restart blood per MD. pRBC continued with no adverse reactions.

## 2015-08-26 NOTE — Consult Note (Signed)
GYNECOLOGY CONSULT HISTORY AND PHYSICAL  Reason for Consult: Postmenopausal bleeding Referring Physician: Demetrios Loll, MD (Hospitalist)  HPI:  Ashley Hurst is an 55 y.o. P0 female with recent history of back surgery and bilateral DVTs  (diagnosed in January 2017), on anticoagulant therapy Alen Blew, who is currently admitted for treatment of severe anemia secondary to heavy vaginal bleeding over the weekend.  Patient notes 2 episodes of bleeding occurred, both very heavy.  Is also complaining of melena.  Was unsure if she was also having gross rectal bleeding.  Has been evaluated by GI who deny rectal bleeding as source.  Patient was also noted to have SOB and syncopal episode, which prompted her follow-up in the Emergency Room. Hgb was noted to be 5.2 in the ED.  Patient has received 2 units of PRBC transfusion.  Current Hgb 7.8. Patient currently denies any further episodes of heavy bleeding since yesterday.  Alen Blew was also discontinued on admission.   Pertinent Gynecological History: Menarche age: 71  Menses: post-menopausal since age 46, however notes the occasional episode of a few spots of blood "here and there" over the course of the years.  DES exposure: denies Blood transfusions: received 2 units this admission. No prior h/o transfusions.  Sexually transmitted diseases: no past history Previous GYN Procedures: none  Last pap: normal Date: 10/2014 (per patient) OB History: G0, P0    Past Medical History  Diagnosis Date  . Hypertension   . Hyperlipidemia   . Depression   . Hypothyroidism   . Anxiety   . Shortness of breath dyspnea   . DVT (deep venous thrombosis) [facility])     Past Surgical History  Procedure Laterality Date  . Knee surgery Left 1993  . Hernia repair    . Anterior lumbar disc arthroplasty N/A 06/04/2015    Procedure: L4-5 L5-S1 Lumbar artificial disc replacement with Dr. Scot Dock for approach;  Surgeon: Kristeen Miss, MD;  Location: [facility] NEURO ORS;  Service:  Neurosurgery;  Laterality: N/A;  L4-5 L5-S1 Lumbar artificial disc replacement with Dr. Scot Dock for approach  . Abdominal exposure N/A 06/04/2015    Procedure: ABDOMINAL EXPOSURE;  Surgeon: Angelia Mould, MD;  Location: [facility] NEURO ORS;  Service: Vascular;  Laterality: N/A;  ABDOMINAL EXPOSURE  . Back surgery      Family History  Problem Relation Age of Onset  . Hypertension Mother     Social History   Social History  . Marital Status: Single    Spouse Name: N/A  . Number of Children: N/A  . Years of Education: N/A   Occupational History  . Not on file.   Social History Main Topics  . Smoking status: Never Smoker   . Smokeless tobacco: Never Used  . Alcohol Use: Yes     Comment: [email or web address]  . Drug Use: No  . Sexual Activity: Not on file   Other Topics Concern  . Not on file   Social History Narrative    Allergies: No Known Allergies  Medications:  I have reviewed the patient's current medications. Prior to Admission:  Prescriptions prior to admission  Medication Sig Dispense Refill Last Dose  . ALPRAZolam (XANAX) 1 MG tablet Take 0.5 mg by mouth 2 (two) times daily as needed for anxiety. No frequency of medications is noted in list provided by Dr Landis Gandy  [facility]   unknown  . aspirin EC 81 MG tablet Take 81 mg by mouth every morning.   05/28/2015  . busPIRone (BUSPAR) 10  MG tablet Take 10 mg by mouth 2 (two) times daily.   08/24/2015 at Unknown time  . citalopram (CELEXA) 20 MG tablet Take 20 mg by mouth every morning.  3 08/24/2015 at Unknown time  . cyanocobalamin 1000 MCG tablet Take 1,000 mcg by mouth daily.   08/24/2015 at Unknown time  . diazepam (VALIUM) 5 MG tablet Take 1 tablet (5 mg total) by mouth every 6 (six) hours as needed for muscle spasms. 60 tablet 0 unknown  . diclofenac (VOLTAREN) 75 MG EC tablet Take 75 mg by mouth 2 (two) times daily.  0 08/24/2015 at Unknown time  . estradiol (ESTRACE) 1 MG tablet Take 1 mg by mouth daily.     08/24/2015 at Unknown time  . ferrous sulfate 325 (65 FE) MG tablet Take 325 mg by mouth daily.  1 08/24/2015 at Unknown time  . fluticasone (FLONASE) 50 MCG/ACT nasal spray Place 1 spray into both nostrils as needed for allergies.    unknown  . Hydrocodone-Acetaminophen 5-300 MG TABS Take 1 tablet by mouth every 6 (six) hours as needed. for pain  0 unknown  . levothyroxine (SYNTHROID, LEVOTHROID) 50 MCG tablet Take 50 mcg by mouth daily before breakfast.   08/24/2015 at Unknown time  . lisinopril-hydrochlorothiazide (PRINZIDE,ZESTORETIC) 20-25 MG tablet Take 1 tablet by mouth daily.    08/24/2015 at Unknown time  . medroxyPROGESTERone (PROVERA) 2.5 MG tablet Take 2.5 mg by mouth daily.  0 08/24/2015 at Unknown time  . Multiple Vitamins-Minerals (COMPLETE MULTIVITAMIN/MINERAL PO) Take 1 tablet by mouth every morning.   08/24/2015 at Unknown time  . Omega-3 Fatty Acids (FISH OIL) 600 MG CAPS Take 600 mg by mouth daily.   08/24/2015 at Unknown time  . oxyCODONE-acetaminophen (PERCOCET/ROXICET) 5-325 MG tablet Take 1-2 tablets by mouth every 4 (four) hours as needed for severe pain. 60 tablet 0 unknown  . XARELTO 20 MG TABS tablet Take 20 mg by mouth daily with supper.  12 08/24/2015 at Unknown time  . zolpidem (AMBIEN) 10 MG tablet Take 10 mg by mouth at bedtime.    08/24/2015 at Unknown time    Review of Systems  Constitutional: Positive for malaise/fatigue and diaphoresis. Negative for fever, chills and weight loss.  HENT: Negative for congestion and sore throat.   Eyes: Negative for blurred vision, pain and discharge.  Respiratory: Positive for shortness of breath. Negative for cough, hemoptysis and wheezing.   Cardiovascular: Negative for chest pain, palpitations and leg swelling.  Gastrointestinal: Positive for abdominal pain, constipation and melena. Negative for heartburn, nausea, vomiting and diarrhea.       Mild intermittent pain associated near abdominal incision.   Genitourinary: Negative  for dysuria, urgency, frequency, hematuria and flank pain.  Musculoskeletal: Negative for myalgias and joint pain.  Skin: Negative for itching and rash.  Neurological: Positive for loss of consciousness. Negative for dizziness and headaches.       Brief syncopal yesterday, prior to presentation to ER.  Endo/Heme/Allergies: Negative for polydipsia. Does not bruise/bleed easily.  Psychiatric/Behavioral: Negative for depression and suicidal ideas. The patient does not have insomnia.     Blood pressure 106/61, pulse 68, temperature 98.7 F (37.1 C), temperature source Oral, resp. rate 18, height 5' 5.5" (1.664 m), weight 135 lb (61.236 kg), last menstrual period 08/23/2015, SpO2 98 %. Physical Exam  Constitutional: She is oriented to person, place, and time. She appears well-developed and well-nourished. No distress.  HENT:  Head: Normocephalic and atraumatic.  Right Ear: External ear normal.  Left Ear: External ear normal.  Nose: Nose normal.  Mouth/Throat: Oropharynx is clear and moist. No oropharyngeal exudate.  Eyes: Conjunctivae and EOM are normal. Pupils are equal, round, and reactive to light. No scleral icterus.  Neck: Neck supple. No tracheal deviation present. No thyromegaly present.  Cardiovascular: Normal rate, regular rhythm and normal heart sounds.  Exam reveals no gallop and no friction rub.   No murmur heard. Respiratory: Breath sounds normal. No respiratory distress. She has no wheezes. She exhibits no tenderness.  GI: Soft. Bowel sounds are normal. She exhibits no distension and no mass. There is tenderness. There is no rebound and no guarding.  Transverse laparotomy scar midway between pubic symphysis and umbilicus, well healed.  Mild tenderness noted there.  Genitourinary: Vagina normal. No vaginal discharge found.  External genitalia normal.  Speculum:Vaginal vault with small blood clot, but otherwise normal.  Cervix visible, no lesions.   Bimanual with enlarged uterus,  ~ 14-16 week size? (diffiuclt to assess as patient with guarding when pressing on abdomen near incision site), non-mobile. Cervix firm.  Difficult to assess adnexae but no palpable masses appreciated, nontender.   Musculoskeletal: Normal range of motion. She exhibits no edema or tenderness.  Lymphadenopathy:    She has no cervical adenopathy.  Neurological: She is alert and oriented to person, place, and time.  Skin: Skin is warm and dry. No rash noted. No erythema. No pallor.  Psychiatric: She has a normal mood and affect. Her behavior is normal.    Labs:  Results for orders placed or performed during the hospital encounter of 08/25/15 (from the past 48 hour(s))  Comprehensive metabolic panel     Status: Abnormal   Collection Time: 08/25/15  1:21 PM  Result Value Ref Range   Sodium 133 (L) 135 - 145 mmol/L   Potassium 3.5 3.5 - 5.1 mmol/L   Chloride 103 101 - 111 mmol/L   CO2 20 (L) 22 - 32 mmol/L   Glucose, Bld 107 (H) 65 - 99 mg/dL   BUN 11 6 - 20 mg/dL   Creatinine, Ser 0.63 0.44 - 1.00 mg/dL   Calcium 8.3 (L) 8.9 - 10.3 mg/dL   Total Protein 6.2 (L) 6.5 - 8.1 g/dL   Albumin 3.2 (L) 3.5 - 5.0 g/dL   AST 23 15 - 41 U/L   ALT 21 14 - 54 U/L   Alkaline Phosphatase 51 38 - 126 U/L   Total Bilirubin 0.5 0.3 - 1.2 mg/dL   GFR calc non Af Amer >60 >60 mL/min   GFR calc Af Amer >60 >60 mL/min    Comment: (NOTE) The eGFR has been calculated using the CKD EPI equation.  This calculation has not been validated in all clinical situations.  eGFR's persistently <60 mL/min signify possible Chronic Kidney Disease.    Anion gap 10 5 - 15  Protime-INR     Status: Abnormal   Collection Time: 08/25/15  1:21 PM  Result Value Ref Range   Prothrombin Time 17.5 (H) 11.4 - 15.0 seconds   INR 1.43   APTT     Status: None   Collection Time: 08/25/15  1:21 PM  Result Value Ref Range   aPTT 31 24 - 36 seconds  Magnesium     Status: None   Collection Time: 08/25/15  1:21 PM  Result Value Ref Range    Magnesium 1.8 1.7 - 2.4 mg/dL  ABO/Rh     Status: None   Collection Time: 08/25/15  1:23 PM  Result Value Ref Range   ABO/RH(D) A NEG   Prepare RBC     Status: None   Collection Time: 08/25/15  3:30 PM  Result Value Ref Range   Order Confirmation ORDER PROCESSED BY BLOOD BANK   Hemoglobin     Status: Abnormal   Collection Time: 08/26/15  2:20 AM  Result Value Ref Range   Hemoglobin 7.6 (L) 12.0 - 16.0 g/dL  MRSA PCR Screening     Status: None   Collection Time: 08/26/15  2:27 AM  Result Value Ref Range   MRSA by PCR NEGATIVE NEGATIVE    Comment:        The GeneXpert MRSA Assay (FDA approved for NASAL specimens only), is one component of a comprehensive MRSA colonization surveillance program. It is not intended to diagnose MRSA infection nor to guide or monitor treatment for MRSA infections.   Basic metabolic panel     Status: Abnormal   Collection Time: 08/26/15  8:33 AM  Result Value Ref Range   Sodium 138 135 - 145 mmol/L    Comment: ELECTROLYTES REPEATED   Potassium 4.0 3.5 - 5.1 mmol/L   Chloride 112 (H) 101 - 111 mmol/L   CO2 24 22 - 32 mmol/L   Glucose, Bld 86 65 - 99 mg/dL   BUN 6 6 - 20 mg/dL   Creatinine, Ser 0.63 0.44 - 1.00 mg/dL   Calcium 8.0 (L) 8.9 - 10.3 mg/dL   GFR calc non Af Amer >60 >60 mL/min   GFR calc Af Amer >60 >60 mL/min    Comment: (NOTE) The eGFR has been calculated using the CKD EPI equation. This calculation has not been validated in all clinical situations. eGFR's persistently <60 mL/min signify possible Chronic Kidney Disease.    Anion gap 2 (L) 5 - 15   CBC Latest Ref Rng 08/26/2015 08/26/2015 08/25/2015  WBC 3.6 - 11.0 K/uL 4.5 - 4.7  Hemoglobin 12.0 - 16.0 g/dL 7.8(L) 7.6(L) 5.2(L)  Hematocrit 35.0 - 47.0 % 23.6(L) - 16.3(L)  Platelets 150 - 440 K/uL 330 - 415    Imaging:   Dg Chest 2 View  08/25/2015  CLINICAL DATA:  55 year old female with shortness of breath since yesterday. Spine surgery in January. Initial encounter. EXAM:  CHEST  2 VIEW COMPARISON:  No prior chest radiographs. FINDINGS: Lung volumes are within normal limits. Increased interstitial markings bilaterally. Cardiac size at the upper limits of normal. Other mediastinal contours are within normal limits. Visualized tracheal air column is within normal limits. No pneumothorax, pleural effusion or confluent pulmonary opacity. Mild scoliosis. No acute osseous abnormality identified. IMPRESSION: Nonspecific increased pulmonary interstitial markings, might be chronic but consider viral/atypical respiratory infection. No pleural effusion or other acute cardiopulmonary abnormality identified. Electronically Signed   By: Genevie Ann M.D.   On: 08/25/2015 14:18   Dg Chest Port 1 View  08/26/2015  CLINICAL DATA:  55 year old female with shortness of breath EXAM: PORTABLE CHEST 1 VIEW COMPARISON:  Radiograph dated 08/25/2015 FINDINGS: The heart size and mediastinal contours are within normal limits. Both lungs are clear. The visualized skeletal structures are unremarkable. IMPRESSION: No active disease. Electronically Signed   By: Anner Crete M.D.   On: 08/26/2015 00:17    Assessment/Plan: 1. Postmenopausal bleeding - Likely cause is the use of anticoagulant therapy.  Has had no further episodes of bleeding since medication discontinued.  However, based on today's exam, patient uterus does feel somewhat enlarged (although difficulty to adequately assess due to patient's tenderness  to palpation near her incision site).  Will order pelvic ultrasound to assess reproductive anatomy.  Will f/u with results. Reports h/o normal pap smears in the past.  2. Symptomatic anemia - Treated with 2 units PRBCs.  No further episodes of bleeding, Hgb remains stable at 7.8.  Patient denies any further symptoms.  Will likely additional supplemental iron (can be PO) as outpatient.  Continue to monitor hemodynamics.  3. Melena - Seen by GI, who recommend considering EGD +/- colonoscopy as  necessary (can likely be performed outpatient). Is currently on Protonix.     Rubie Maid 08/26/2015

## 2015-08-26 NOTE — Progress Notes (Signed)
[place] [facility] [place] NAME: Ashley Hurst    MR#:  [id number]  DATE OF BIRTH:  07-13-1960  SUBJECTIVE:   Patient without dizziness, lightheadedness or chest pain. She had a episode of shortness of breath while receiving blood transfusion yesterday.  REVIEW OF SYSTEMS:    Review of Systems  Constitutional: Negative for fever, chills and malaise/fatigue.  HENT: Negative for ear discharge, ear pain, hearing loss, nosebleeds and sore throat.   Eyes: Negative for blurred vision and pain.  Respiratory: Negative for cough, hemoptysis, shortness of breath and wheezing.   Cardiovascular: Negative for chest pain, palpitations and leg swelling.  Gastrointestinal: Positive for melena (chronic). Negative for nausea, vomiting, abdominal pain, diarrhea and blood in stool.  Genitourinary: Negative for dysuria.  Musculoskeletal: Negative for back pain.  Neurological: Negative for dizziness, tremors, speech change, focal weakness, seizures and headaches.  Endo/Heme/Allergies: Does not bruise/bleed easily.  Psychiatric/Behavioral: Negative for depression, suicidal ideas and hallucinations.    Tolerating Diet:Yes      DRUG ALLERGIES:  No Known Allergies  VITALS:  Blood pressure 106/61, pulse 68, temperature 98.7 F (37.1 C), temperature source Oral, resp. rate 18, height 5' 5.5" (1.664 m), weight 61.236 kg (135 lb), last menstrual period 08/23/2015, SpO2 98 %.  PHYSICAL EXAMINATION:   Physical Exam  Constitutional: She is oriented to person, place, and time and well-developed, well-nourished, and in no distress. No distress.  HENT:  Head: Normocephalic.  Eyes: No scleral icterus.  Neck: Normal range of motion. Neck supple. No JVD present. No tracheal deviation present.  Cardiovascular: Normal rate, regular rhythm and normal heart sounds.  Exam reveals no gallop and no friction rub.   No murmur heard. Pulmonary/Chest: Effort normal and  breath sounds normal. No respiratory distress. She has no wheezes. She has no rales. She exhibits no tenderness.  Abdominal: Soft. Bowel sounds are normal. She exhibits no distension and no mass. There is no tenderness. There is no rebound and no guarding.  Musculoskeletal: Normal range of motion. She exhibits no edema.  Neurological: She is alert and oriented to person, place, and time.  Skin: Skin is warm. No rash noted. No erythema.  tatoos on arms/leg  Psychiatric: Affect and judgment normal.      LABORATORY PANEL:   CBC  Recent Labs Lab 08/26/15 0833  WBC 4.5  HGB 7.8*  HCT 23.6*  PLT 330   ------------------------------------------------------------------------------------------------------------------  Chemistries   Recent Labs Lab 08/25/15 1321 08/26/15 0833  NA 133* 138  K 3.5 4.0  CL 103 112*  CO2 20* 24  GLUCOSE 107* 86  BUN 11 6  CREATININE 0.63 0.63  CALCIUM 8.3* 8.0*  MG 1.8  --   AST 23  --   ALT 21  --   ALKPHOS 51  --   BILITOT 0.5  --    ------------------------------------------------------------------------------------------------------------------  Cardiac Enzymes No results for input(s): TROPONINI in the last 168 hours. ------------------------------------------------------------------------------------------------------------------  RADIOLOGY:  Dg Chest 2 View  08/25/2015  CLINICAL DATA:  55 year old female with shortness of breath since yesterday. Spine surgery in January. Initial encounter. EXAM: CHEST  2 VIEW COMPARISON:  No prior chest radiographs. FINDINGS: Lung volumes are within normal limits. Increased interstitial markings bilaterally. Cardiac size at the upper limits of normal. Other mediastinal contours are within normal limits. Visualized tracheal air column is within normal limits. No pneumothorax, pleural effusion or confluent pulmonary opacity. Mild scoliosis. No acute osseous abnormality identified. IMPRESSION: Nonspecific  increased pulmonary  interstitial markings, might be chronic but consider viral/atypical respiratory infection. No pleural effusion or other acute cardiopulmonary abnormality identified. Electronically Signed   By: Genevie Ann M.D.   On: 08/25/2015 14:18   Dg Chest Port 1 View  08/26/2015  CLINICAL DATA:  55 year old female with shortness of breath EXAM: PORTABLE CHEST 1 VIEW COMPARISON:  Radiograph dated 08/25/2015 FINDINGS: The heart size and mediastinal contours are within normal limits. Both lungs are clear. The visualized skeletal structures are unremarkable. IMPRESSION: No active disease. Electronically Signed   By: Anner Crete M.D.   On: 08/26/2015 00:17     ASSESSMENT AND PLAN:    55 year old female with recent history of back surgery and bilateral DVT diagnosed in January on XARELTO who presented due to severe anemia and vaginal bleeding.  1. Severe acute blood loss anemia: This is due to anti-coagulation with surround toe. Patient is status post 2 units PRBCs. Hemoglobin remained stable. No blood transfusion at this time. Continue ferrous sulfate 2. Vaginal bleeding: Seen by OB/GYN. Pelvic ultrasound ordered.  3. History DVT: Order lower extremity Dopplers to see if patient has persistent DVT. If she does have DVT then will need IVC.   4. Anxiety and depression: Continue Xanax, BuSpar,     5. Melena: Patient will need  endoscopy as per GI consult. (Likely can be performed as outpatient) Continue PPI.  6. Hypothyroidism: Continue Synthroid.  7. History of hypertension: Outpatient medications are on hold due to low/normal blood pressure.   Management plans discussed with the patient and she is in agreement.  CODE STATUS: Full  TOTAL TIME TAKING CARE OF THIS PATIENT: 31 minutes.     POSSIBLE D/C tomorrow, DEPENDING ON CLINICAL CONDITION.   Meshilem Machuca M.D on 08/26/2015 at 10:36 AM  Between 7am to 6pm - Pager - [phone] After 6pm go to [email or web address] - password  [facility] [place] [facility]  Office  [phone]  CC: Primary care physician; Pcp Not In System  Note: This dictation was prepared with Dragon dictation along with smaller phrase technology. Any transcriptional errors that result from this process are unintentional.

## 2015-08-26 NOTE — Consult Note (Addendum)
GYNECOLOGY CONSULT NOTE ADDENDUM:  Patient s/p pelvic ultrasound.  Discussed ultrasound results with patient.  Ultrasound with enlarged uterus, and multiple fibroids, and normal right ovary, but left not visualized (see report below).  Patient denies any previous knowledge of having fibroids. I believe that the cause of her vaginal bleeding may be multifactorial, with fibroid uterus (although alone, usually do not cause major symptoms in postmenopausal women) coupled with anticoagulant use.  Patient no longer on anticoagulant.  Noted episode of bleeding after pelvic exam this morning.  Will treat patient with Provera 10 mg tablets (to take daily x 10 days).  Also would recommend endometrial biopsy as endometrial stripe could not be assessed due to distortion of uterine cavity by fibroids, however I would prefer to have this performed in the office if possible.  This would be performed to r/o other potential causes of postmenopausal bleeding (including malignancy).  Also would recommend checking a TSH on patient, as this can also be a cause of abnormal uterine bleeding.   Patient also notes h/o anemia after further discussion, states that she has been taking iron pills as recommended by her GYN ([facility]) since last year.  Unsure cause at this point, could also be related to fibroids, or could be separate process.  Would recommend further workup by Hematology. Also could be the cause of her dark stools.     Gave patient information on fibroids and on desired endometrial biopsy procedure to be performed.  Will follow up tomorrow with patient to assess if any further episodes of bleeding.    Pelvic Ultrasound 08/25/2016:  FINDINGS: Uterus  Measurements: 13.7 x 7.7 x 10.4 cm. Bulky uterine fibroids, up to 7.8 cm diameter individually. The largest at the fundus appears intramural or sub serosal. There is a sub serosal fibroid along the left cornu or body measuring 4.7 cm  diameter.  Endometrium  Thickness: Not visible, suspected to be distorted due to the multiple fibroids.  Right ovary  Measurements: 2.4 x 1.6 x 1.8 cm. Normal appearance/no adnexal mass.  Left ovary  Measurements: Not visualized.  Other findings  No abnormal free fluid.  IMPRESSION: 1. Fibroid uterus, with multiple lesions up to 7.8 cm individually. These appear to distort the endometrium which is not reliably visible, and are considered a likely source of the intermittent bleeding. 2. Normal right ovary. Left ovary not visualized. 3. No pelvic free fluid.   Rubie Maid, MD [facility]

## 2015-08-27 ENCOUNTER — Encounter
Admission: EM | Disposition: A | Discharge status: Home / Self Care | Payer: Self-pay | Source: Home / Self Care | Attending: Internal Medicine

## 2015-08-27 HISTORY — PX: PERIPHERAL VASCULAR CATHETERIZATION: SHX172C

## 2015-08-27 LAB — IRON AND TIBC: TIBC: 328 ug/dL (ref 250–450)

## 2015-08-27 LAB — TYPE AND SCREEN
ABO/RH(D): A NEG
ANTIBODY SCREEN: NEGATIVE
UNIT DIVISION: 0
Unit division: 0
Unit division: 0

## 2015-08-27 LAB — CBC
HEMATOCRIT: 24.4 % — AB (ref 35.0–47.0)
HEMOGLOBIN: 7.7 g/dL — AB (ref 12.0–16.0)
MCH: 28.6 pg (ref 26.0–34.0)
MCHC: 31.7 g/dL — ABNORMAL LOW (ref 32.0–36.0)
MCV: 90.3 fL (ref 80.0–100.0)
Platelets: 335 10*3/uL (ref 150–440)
RBC: 2.71 MIL/uL — ABNORMAL LOW (ref 3.80–5.20)
RDW: 16.5 % — AB (ref 11.5–14.5)
WBC: 5.7 10*3/uL (ref 3.6–11.0)

## 2015-08-27 LAB — RETICULOCYTES
RBC.: 2.69 MIL/uL — ABNORMAL LOW (ref 3.80–5.20)
RETIC COUNT ABSOLUTE: 161.4 10*3/uL (ref 19.0–183.0)
Retic Ct Pct: 6 % — ABNORMAL HIGH (ref 0.4–3.1)

## 2015-08-27 LAB — FERRITIN: Ferritin: 15 ng/mL (ref 11–307)

## 2015-08-27 LAB — TSH: TSH: 2.113 u[IU]/mL (ref 0.350–4.500)

## 2015-08-27 LAB — VITAMIN B12: VITAMIN B 12: 481 pg/mL (ref 180–914)

## 2015-08-27 LAB — FOLATE: FOLATE: 18.3 ng/mL (ref >5.9)

## 2015-08-27 SURGERY — IVC FILTER INSERTION
Anesthesia: Moderate Sedation

## 2015-08-27 MED ORDER — LIDOCAINE HCL (PF) 1 % IJ SOLN
INTRAMUSCULAR | Status: AC
  Filled 2015-08-27: qty 30

## 2015-08-27 MED ORDER — MIDAZOLAM HCL 2 MG/2ML IJ SOLN
INTRAMUSCULAR | Status: DC | PRN
  Administered 2015-08-27: 2 mg via INTRAVENOUS

## 2015-08-27 MED ORDER — DEXTROSE 5 % IV SOLN
1.5000 g | Freq: Once | INTRAVENOUS | Status: AC
  Administered 2015-08-27: 1.5 g via INTRAVENOUS

## 2015-08-27 MED ORDER — FENTANYL CITRATE (PF) 100 MCG/2ML IJ SOLN
INTRAMUSCULAR | Status: DC | PRN
  Administered 2015-08-27: 100 ug via INTRAVENOUS

## 2015-08-27 MED ORDER — MEDROXYPROGESTERONE ACETATE 10 MG PO TABS: 10.0000 mg | ORAL_TABLET | Freq: Every day | ORAL | Status: DC

## 2015-08-27 MED ORDER — MIDAZOLAM HCL 2 MG/2ML IJ SOLN
INTRAMUSCULAR | Status: AC
  Filled 2015-08-27: qty 2

## 2015-08-27 MED ORDER — HEPARIN (PORCINE) IN NACL 2-0.9 UNIT/ML-% IJ SOLN
INTRAMUSCULAR | Status: AC
  Filled 2015-08-27: qty 500

## 2015-08-27 MED ORDER — PANTOPRAZOLE SODIUM 40 MG PO TBEC: 40.0000 mg | DELAYED_RELEASE_TABLET | Freq: Every day | ORAL | Status: DC

## 2015-08-27 MED ORDER — SODIUM CHLORIDE FLUSH 0.9 % IV SOLN
INTRAVENOUS | Status: AC
  Filled 2015-08-27: qty 50

## 2015-08-27 MED ORDER — FENTANYL CITRATE (PF) 100 MCG/2ML IJ SOLN
INTRAMUSCULAR | Status: AC
  Filled 2015-08-27: qty 2

## 2015-08-27 SURGICAL SUPPLY — 3 items
FILTER VC CELECT-FEMORAL (Filter) ×3 IMPLANT
PACK ANGIOGRAPHY (CUSTOM PROCEDURE TRAY) ×3 IMPLANT
WIRE J 3MM .035X145CM (WIRE) ×3 IMPLANT

## 2015-08-27 NOTE — Progress Notes (Signed)
Patient is alert and oriented, reporting no pain. Right groin dressing CDI, pulses WNL. VSS. Report called to floor RN, Marzetta Board. Will transfer shortly.

## 2015-08-27 NOTE — Discharge Summary (Signed)
[facility] [place] NAME: Ashley Hurst    MR#:  [id number]  DATE OF BIRTH:  August 26, 1960  DATE OF ADMISSION:  08/25/2015 ADMITTING PHYSICIAN: Demetrios Loll, MD  DATE OF DISCHARGE: 08/27/2015  PRIMARY CARE PHYSICIAN: Pcp Not In System    ADMISSION DIAGNOSIS:  Anemia due to acute blood loss [D62]  DISCHARGE DIAGNOSIS:  Principal Problem:   GI bleeding Active Problems:   Anemia   SECONDARY DIAGNOSIS:   Past Medical History  Diagnosis Date  . Hypertension   . Hyperlipidemia   . Depression   . Hypothyroidism   . Anxiety   . Shortness of breath dyspnea   . DVT (deep venous thrombosis) [facility])     HOSPITAL COURSE:   55 year old female with recent history of back surgery and bilateral DVT diagnosed in January on XARELTO who presented due to severe anemia and vaginal bleeding.  1. Severe acute blood loss anemia: This is due to anti-coagulation and possibly FIBROID seen in pelvic ultrasound. She is status post 2 units PRBCs with stable hemoglobin. She will Continue ferrous sulfate and Protonix  2. Vaginal bleeding: Pelvic ultrasound showed fibroids. She will need further GYN follow-up with probable endometrial biopsy. She was given a 10 day course of prednisone for vaginal bleeding. Within this timeframe she will follow-up with GYN.  3. Left LE DVT: She will have IVCF placed on day of discharge and will follow-up with vascular surgery in 2 weeks. She developed bilateral DVTs after her back surgery in January. She was asked to discontinue estrogen, as this is also an etiology for DVT/PE.  4. Anxiety and depression: Continue Xanax, BuSpar,   5. Melena: Patient will needendoscopy as per GI consult. Continue PPI. She is also on iron supplementation which could cause dark stools. She will have follow-up with GI in 2 weeks. 6. Hypothyroidism: Continue Synthroid.  7. History of hypertension: She may resume outpatient  medications.   DISCHARGE CONDITIONS AND DIET:  Stable for discharge on a regular diet  CONSULTS OBTAINED:  Treatment Team:  Lucilla Lame, MD  DRUG ALLERGIES:  No Known Allergies  DISCHARGE MEDICATIONS:   Current Discharge Medication List    START taking these medications   Details  pantoprazole (PROTONIX) 40 MG tablet Take 1 tablet (40 mg total) by mouth daily. Qty: 30 tablet, Refills: 0      CONTINUE these medications which have CHANGED   Details  medroxyPROGESTERone (PROVERA) 10 MG tablet Take 1 tablet (10 mg total) by mouth daily. Qty: 9 tablet, Refills: 0      CONTINUE these medications which have NOT CHANGED   Details  ALPRAZolam (XANAX) 1 MG tablet Take 0.5 mg by mouth 2 (two) times daily as needed for anxiety. No frequency of medications is noted in list provided by Dr Landis Gandy  [facility]    busPIRone (BUSPAR) 10 MG tablet Take 10 mg by mouth 2 (two) times daily.    citalopram (CELEXA) 20 MG tablet Take 20 mg by mouth every morning. Refills: 3    cyanocobalamin 1000 MCG tablet Take 1,000 mcg by mouth daily.    diazepam (VALIUM) 5 MG tablet Take 1 tablet (5 mg total) by mouth every 6 (six) hours as needed for muscle spasms. Qty: 60 tablet, Refills: 0    ferrous sulfate 325 (65 FE) MG tablet Take 325 mg by mouth daily. Refills: 1    fluticasone (FLONASE) 50 MCG/ACT nasal spray Place 1 spray into both  nostrils as needed for allergies.     Hydrocodone-Acetaminophen 5-300 MG TABS Take 1 tablet by mouth every 6 (six) hours as needed. for pain Refills: 0    levothyroxine (SYNTHROID, LEVOTHROID) 50 MCG tablet Take 50 mcg by mouth daily before breakfast.    lisinopril-hydrochlorothiazide (PRINZIDE,ZESTORETIC) 20-25 MG tablet Take 1 tablet by mouth daily.     Multiple Vitamins-Minerals (COMPLETE MULTIVITAMIN/MINERAL PO) Take 1 tablet by mouth every morning.    Omega-3 Fatty Acids (FISH OIL) 600 MG CAPS Take 600 mg by mouth daily.     zolpidem (AMBIEN) 10 MG tablet Take 10 mg by mouth at bedtime.       STOP taking these medications     aspirin EC 81 MG tablet      diclofenac (VOLTAREN) 75 MG EC tablet      estradiol (ESTRACE) 1 MG tablet      oxyCODONE-acetaminophen (PERCOCET/ROXICET) 5-325 MG tablet      XARELTO 20 MG TABS tablet               Today   CHIEF COMPLAINT:  Patient would like to go home. No dizziness, shortness of breath or chest pain.   VITAL SIGNS:  Blood pressure 114/57, pulse 59, temperature 98.3 F (36.8 C), temperature source Oral, resp. rate 18, height 5' 5.5" (1.664 m), weight 61.236 kg (135 lb), last menstrual period 08/23/2015, SpO2 93 %.   REVIEW OF SYSTEMS:  Review of Systems  Constitutional: Negative for fever, chills and malaise/fatigue.  HENT: Negative for ear discharge, ear pain, hearing loss, nosebleeds and sore throat.   Eyes: Negative for blurred vision and pain.  Respiratory: Negative for cough, hemoptysis, shortness of breath and wheezing.   Cardiovascular: Negative for chest pain, palpitations and leg swelling.  Gastrointestinal: Negative for nausea, vomiting, abdominal pain, diarrhea and blood in stool.  Genitourinary: Negative for dysuria, frequency, hematuria and flank pain.  Musculoskeletal: Negative for back pain.  Neurological: Negative for dizziness, tremors, speech change, focal weakness, seizures and headaches.  Endo/Heme/Allergies: Does not bruise/bleed easily.  Psychiatric/Behavioral: Negative for depression, suicidal ideas and hallucinations.     PHYSICAL EXAMINATION:  GENERAL:  55 y.o.-year-old patient lying in the bed with no acute distress.  NECK:  Supple, no jugular venous distention. No thyroid enlargement, no tenderness.  LUNGS: Normal breath sounds bilaterally, no wheezing, rales,rhonchi  No use of accessory muscles of respiration.  CARDIOVASCULAR: S1, S2 normal. No murmurs, rubs, or gallops.  ABDOMEN: Soft, non-tender, non-distended.  Bowel sounds present. No organomegaly or mass.  EXTREMITIES: No pedal edema, cyanosis, or clubbing.  PSYCHIATRIC: The patient is alert and oriented x 3.  SKIN: No obvious rash, lesion, or ulcer.   DATA REVIEW:   CBC  Recent Labs Lab 08/27/15 0522  WBC 5.7  HGB 7.7*  HCT 24.4*  PLT 335    Chemistries   Recent Labs Lab 08/25/15 1321 08/26/15 0833  NA 133* 138  K 3.5 4.0  CL 103 112*  CO2 20* 24  GLUCOSE 107* 86  BUN 11 6  CREATININE 0.63 0.63  CALCIUM 8.3* 8.0*  MG 1.8  --   AST 23  --   ALT 21  --   ALKPHOS 51  --   BILITOT 0.5  --     Cardiac Enzymes No results for input(s): TROPONINI in the last 168 hours.  Microbiology Results  @[email or web address] @  RADIOLOGY:  Dg Chest 2 View  08/25/2015  CLINICAL DATA:  55 year old female with shortness of breath since yesterday. Spine  surgery in January. Initial encounter. EXAM: CHEST  2 VIEW COMPARISON:  No prior chest radiographs. FINDINGS: Lung volumes are within normal limits. Increased interstitial markings bilaterally. Cardiac size at the upper limits of normal. Other mediastinal contours are within normal limits. Visualized tracheal air column is within normal limits. No pneumothorax, pleural effusion or confluent pulmonary opacity. Mild scoliosis. No acute osseous abnormality identified. IMPRESSION: Nonspecific increased pulmonary interstitial markings, might be chronic but consider viral/atypical respiratory infection. No pleural effusion or other acute cardiopulmonary abnormality identified. Electronically Signed   By: Genevie Ann M.D.   On: 08/25/2015 14:18   US Transvaginal Non-ob  08/26/2015  CLINICAL DATA:  55 year old female with intermittent postmenopausal bleeding. Had been on Xarelto. EXAM: TRANSABDOMINAL AND TRANSVAGINAL ULTRASOUND OF PELVIS TECHNIQUE: Both transabdominal and transvaginal ultrasound examinations of the pelvis were performed. Transabdominal technique was performed for global imaging of the pelvis  including uterus, ovaries, adnexal regions, and pelvic cul-de-sac. It was necessary to proceed with endovaginal exam following the transabdominal exam to visualize the ovaries. COMPARISON:  None FINDINGS: Uterus Measurements: 13.7 x 7.7 x 10.4 cm. Bulky uterine fibroids, up to 7.8 cm diameter individually. The largest at the fundus appears intramural or sub serosal. There is a sub serosal fibroid along the left cornu or body measuring 4.7 cm diameter. Endometrium Thickness: Not visible, suspected to be distorted due to the multiple fibroids. Right ovary Measurements: 2.4 x 1.6 x 1.8 cm. Normal appearance/no adnexal mass. Left ovary Measurements: Not visualized. Other findings No abnormal free fluid. IMPRESSION: 1. Fibroid uterus, with multiple lesions up to 7.8 cm individually. These appear to distort the endometrium which is not reliably visible, and are considered a likely source of the intermittent bleeding. 2. Normal right ovary.  Left ovary not visualized. 3. No pelvic free fluid. Electronically Signed   By: Genevie Ann M.D.   On: 08/26/2015 15:19   US Pelvis Complete  08/26/2015  CLINICAL DATA:  55 year old female with intermittent postmenopausal bleeding. Had been on Xarelto. EXAM: TRANSABDOMINAL AND TRANSVAGINAL ULTRASOUND OF PELVIS TECHNIQUE: Both transabdominal and transvaginal ultrasound examinations of the pelvis were performed. Transabdominal technique was performed for global imaging of the pelvis including uterus, ovaries, adnexal regions, and pelvic cul-de-sac. It was necessary to proceed with endovaginal exam following the transabdominal exam to visualize the ovaries. COMPARISON:  None FINDINGS: Uterus Measurements: 13.7 x 7.7 x 10.4 cm. Bulky uterine fibroids, up to 7.8 cm diameter individually. The largest at the fundus appears intramural or sub serosal. There is a sub serosal fibroid along the left cornu or body measuring 4.7 cm diameter. Endometrium Thickness: Not visible, suspected to be  distorted due to the multiple fibroids. Right ovary Measurements: 2.4 x 1.6 x 1.8 cm. Normal appearance/no adnexal mass. Left ovary Measurements: Not visualized. Other findings No abnormal free fluid. IMPRESSION: 1. Fibroid uterus, with multiple lesions up to 7.8 cm individually. These appear to distort the endometrium which is not reliably visible, and are considered a likely source of the intermittent bleeding. 2. Normal right ovary.  Left ovary not visualized. 3. No pelvic free fluid. Electronically Signed   By: Genevie Ann M.D.   On: 08/26/2015 15:19   US Venous Img Lower Bilateral  08/26/2015  CLINICAL DATA:  History DVT.  Shortness of breath. EXAM: BILATERAL LOWER EXTREMITY VENOUS DOPPLER ULTRASOUND TECHNIQUE: Gray-scale sonography with graded compression, as well as color Doppler and duplex ultrasound were performed to evaluate the lower extremity deep venous systems from the level of the common femoral vein  and including the common femoral, femoral, profunda femoral, popliteal and calf veins including the posterior tibial, peroneal and gastrocnemius veins when visible. The superficial great saphenous vein was also interrogated. Spectral Doppler was utilized to evaluate flow at rest and with distal augmentation maneuvers in the common femoral, femoral and popliteal veins. COMPARISON:  No recent. FINDINGS: RIGHT LOWER EXTREMITY Common Femoral Vein: No evidence of thrombus. Normal compressibility, respiratory phasicity and response to augmentation. Saphenofemoral Junction: No evidence of thrombus. Normal compressibility and flow on color Doppler imaging. Profunda Femoral Vein: No evidence of thrombus. Normal compressibility and flow on color Doppler imaging. Femoral Vein: No evidence of thrombus. Normal compressibility, respiratory phasicity and response to augmentation. Popliteal Vein: No evidence of thrombus. Normal compressibility, respiratory phasicity and response to augmentation. Calf Veins: No evidence of  thrombus. Normal compressibility and flow on color Doppler imaging. Other Findings:  None. LEFT LOWER EXTREMITY Common Femoral Vein: No evidence of thrombus. Normal compressibility, respiratory phasicity and response to augmentation. Saphenofemoral Junction: No evidence of thrombus. Normal compressibility and flow on color Doppler imaging. Profunda Femoral Vein: No evidence of thrombus. Normal compressibility and flow on color Doppler imaging. Femoral Vein: Duplicated system.  Occlusive thrombus Popliteal Vein: Nonocclusive thrombus. Calf Veins: Nonocclusive thrombus. Other Findings:  None. IMPRESSION: Positive study for left lower extremity diffuse deep venous thrombosis. Electronically Signed   By: Marcello Moores  Register   On: 08/26/2015 15:20   Dg Chest Port 1 View  08/26/2015  CLINICAL DATA:  55 year old female with shortness of breath EXAM: PORTABLE CHEST 1 VIEW COMPARISON:  Radiograph dated 08/25/2015 FINDINGS: The heart size and mediastinal contours are within normal limits. Both lungs are clear. The visualized skeletal structures are unremarkable. IMPRESSION: No active disease. Electronically Signed   By: Anner Crete M.D.   On: 08/26/2015 00:17      Management plans discussed with the patient and she is in agreement. Stable for discharge home  Patient should follow up with GYN 10 days and GI 2 weeks  CODE STATUS:     Code Status Orders        Start     Ordered   08/25/15 1658  Full code   Continuous     08/25/15 1657    Code Status History    Date Active Date Inactive Code Status Order ID Comments User Context   06/04/2015  2:54 PM 06/07/2015  5:36 PM Full Code [id number]  Kristeen Miss, MD Inpatient      TOTAL TIME TAKING CARE OF THIS PATIENT: 33 minutes.    Note: This dictation was prepared with Dragon dictation along with smaller phrase technology. Any transcriptional errors that result from this process are unintentional.  Johnni Wunschel M.D on 08/27/2015 at 11:07 AM  Between  7am to 6pm - Pager - [phone] After 6pm go to [email or web address] - password [email or web address] [facility]  Office  [phone]  CC: Primary care physician; Pcp Not In System

## 2015-08-27 NOTE — Progress Notes (Signed)
Received report from [facility] saying that procedure went well and patient was doing good.  Patient had no issues and  Alert and oriented after procedure completed. Patient able to sip ginger ale and eat sandwich. Procedure was done in right groin with +2 pedal pulses.  Site in groin covered with tegaderm and gauze. No bruising and bleeding from site at this point. Judd Lien, SN

## 2015-08-27 NOTE — Consult Note (Signed)
HD#3  Subjective: Patient reports no further episodes of vaginal bleeding.  Notes that she would like to discuss the option of hysterectomy further.      Objective: I have reviewed patient's vital signs, medications and labs.  General: alert and no distress Resp: clear to auscultation bilaterally Cardio: regular rate and rhythm, S1, S2 normal, no murmur, click, rub or gallop GI: normal findings: bowel sounds normal and doft and abnormal findings:  mild tenderness near incision site Extremities: extremities normal, atraumatic, no cyanosis or edema Vaginal Bleeding: none    Labs:  CBC Latest Ref Rng 08/27/2015 08/26/2015 08/26/2015  WBC 3.6 - 11.0 K/uL 5.7 - 4.5  Hemoglobin 12.0 - 16.0 g/dL 7.7(L) 7.7(L) 7.8(L)  Hematocrit 35.0 - 47.0 % 24.4(L) - 23.6(L)  Platelets 150 - 440 K/uL 335 - 330    Lab Results  Component Value Date   TSH 2.113 08/27/2015    Assessment/Plan: 1. Postmenopausal bleeding - Likely cause is the use of anticoagulant therapy, however may be compounded with patient's enlarged fibroid uterus. Was initiated on Provera 10 mg yesterday, will need to continue for a total  Of 10 days.  Also recommend f/u in office with me in 2 weeks to perform endometrial biopsy after completion of medication.   2. Symptomatic anemia - Treated with 2 units PRBCs. No further episodes of bleeding since yesterday, Hgb remains stable at 7.7. Patient denies any further symptoms. Continue ferrous sulfate.   3. H/o bilateral DVTs  - Patient s/p lower extremity dopplers yesterday.  Awaiting recommendations by Dr. Benjie Karvonen.   4. Hypothyroidism -  Continue Synthroid. - TSH normal today  5. Melena - Patient will need endoscopy as per GI consult. (Likely can be performed as outpatient) Continue PPI.   Thank you for the consult.  Will discontinue inpatient f/u at this time, and see patient back in clinic in ~ 2 weeks, or she can f/u with primary GYN at [facility] (Dr.  Georga Bora).    LOS: 2 days    Rubie Maid 08/27/2015, 9:23 AM

## 2015-08-27 NOTE — Progress Notes (Signed)
The patient has no GI issues. I will sign off now. Please call with any questions.

## 2015-08-27 NOTE — Op Note (Signed)
[facility]   OPERATIVE NOTE    PRE-OPERATIVE DIAGNOSIS: DVT in association with a GI bleed  POST-OPERATIVE DIAGNOSIS: Same  PROCEDURE: 1.   Ultrasound guidance for vascular access to the right common femoral vein 2.   Catheter placement into the inferior vena cava 3.   Inferior venacavogram 4.   Placement of a Celect IVC filter  SURGEON: Ashley Hurst, Ashley Hurst  ASSISTANT(S): None  ANESTHESIA: Conscious sedation was administered under my direct supervision. IV Versed plus fentanyl were utilized. Continuous ECG, pulse oximetry and blood pressure was monitored throughout the entire procedure. A total of 3 milligrams of Versed and 100 micrograms of fentanyl were utilized.  Conscious sedation was for a total of 25 minutes.  ESTIMATED BLOOD LOSS: minimal  FINDING(S): 1.  Patent IVC  SPECIMEN(S):  none  INDICATIONS:   Ashley Hurst is a 55 y.o. y.o. female who presents with DVT now in association with a GI bleed.  Inferior vena cava filter is indicated for this reason.  Risks and benefits including filter thrombosis, migration, fracture, bleeding, and infection were all discussed.  We discussed that all IVC filters that we place can be removed if desired from the patient once the need for the filter has passed.    DESCRIPTION: After obtaining full informed written consent, the patient was brought back to the vascular suite. The skin was sterilely prepped and draped in a sterile surgical field was created. The right common femoral vein was accessed under direct ultrasound guidance without difficulty with a Seldinger needle and a J-wire was then placed. The dilator is passed over the wire and the delivery sheath was placed into the inferior vena cava.  Inferior venacavogram was performed. This demonstrated a patent IVC with the level of the renal veins at L1.  The filter was then deployed into the inferior vena cava at the level of L2 just below the renal veins. The  delivery sheath was then removed. Pressure was held. Sterile dressings were placed. The patient tolerated the procedure well and was taken to the recovery room in stable condition.  Interpretation: IVC is widely patent no evidence of filling defects. Cava measures 24 mm in diameter. There is reflux of contrast into the left renal vein which is at the level of L1. Filter is deployed at the level of L2 in excellent orientation  COMPLICATIONS: None  CONDITION: Stable  Ashley Cabal, MD  08/27/2015, 2:47 PM

## 2015-09-02 ENCOUNTER — Encounter: Payer: Self-pay | Admitting: Vascular Surgery

## 2015-09-12 ENCOUNTER — Ambulatory Visit (INDEPENDENT_AMBULATORY_CARE_PROVIDER_SITE_OTHER): Payer: 59 | Admitting: Obstetrics and Gynecology

## 2015-09-12 ENCOUNTER — Encounter: Payer: Self-pay | Admitting: Obstetrics and Gynecology

## 2015-09-12 VITALS — BP 119/76 | HR 80 | Ht 64.5 in | Wt 138.8 lb

## 2015-09-12 DIAGNOSIS — D259 Leiomyoma of uterus, unspecified: Secondary | ICD-10-CM | POA: Diagnosis not present

## 2015-09-12 DIAGNOSIS — D62 Acute posthemorrhagic anemia: Secondary | ICD-10-CM | POA: Diagnosis not present

## 2015-09-12 DIAGNOSIS — Z86718 Personal history of other venous thrombosis and embolism: Secondary | ICD-10-CM | POA: Diagnosis not present

## 2015-09-12 DIAGNOSIS — N950 Postmenopausal bleeding: Secondary | ICD-10-CM

## 2015-09-12 NOTE — Patient Instructions (Signed)
Postmenopausal Bleeding Postmenopausal bleeding is any bleeding a woman has after she has entered into menopause. Menopause is the end of a woman's fertile years. After menopause, a woman no longer ovulates or has menstrual periods.  Postmenopausal bleeding can be caused by various things. Any type of postmenopausal bleeding, even if it appears to be a typical menstrual period, is concerning. This should be evaluated by your health care provider. Any treatment will depend on the cause of the bleeding. HOME CARE INSTRUCTIONS Monitor your condition for any changes. The following actions may help to alleviate any discomfort you are experiencing:  Avoid the use of tampons and douches as directed by your health care provider.  Change your pads frequently.  Get regular pelvic exams and Pap tests.  Keep all follow-up appointments for diagnostic tests as directed by your health care provider. SEEK MEDICAL CARE IF:   Your bleeding lasts more than 1 week.  You have abdominal pain.  You have bleeding with sexual intercourse. SEEK IMMEDIATE MEDICAL CARE IF:   You have a fever, chills, headache, dizziness, muscle aches, and bleeding.  You have severe pain with bleeding.  You are passing blood clots.  You have bleeding and need more than 1 pad an hour.  You feel faint. MAKE SURE YOU:  Understand these instructions.  Will watch your condition.  Will get help right away if you are not doing well or get worse.   This information is not intended to replace advice given to you by your health care provider. Make sure you discuss any questions you have with your health care provider.   Document Released: 08/26/2005 Document Revised: 03/08/2013 Document Reviewed: 12/15/2012 Elsevier Interactive Patient Education 2016 [employer]  

## 2015-09-13 LAB — HEMOGLOBIN AND HEMATOCRIT, BLOOD
HEMATOCRIT: 31 % — AB (ref 34.0–46.6)
HEMOGLOBIN: 9.6 g/dL — AB (ref 11.1–15.9)

## 2015-09-16 ENCOUNTER — Encounter: Payer: Self-pay | Admitting: Obstetrics and Gynecology

## 2015-09-16 DIAGNOSIS — Z86718 Personal history of other venous thrombosis and embolism: Secondary | ICD-10-CM | POA: Insufficient documentation

## 2015-09-16 DIAGNOSIS — D259 Leiomyoma of uterus, unspecified: Secondary | ICD-10-CM | POA: Insufficient documentation

## 2015-09-16 DIAGNOSIS — N950 Postmenopausal bleeding: Secondary | ICD-10-CM | POA: Insufficient documentation

## 2015-09-16 LAB — PATHOLOGY

## 2015-09-16 NOTE — Progress Notes (Signed)
GYNECOLOGY PROGRESS NOTE  Subjective:    Patient ID: Ashley Hurst, female    DOB: Aug 03, 1960, 55 y.o.   MRN: [id number]  HPI  Patient is a 55 y.o. P0 female who presents for follow up after recent hospitalization 3 weeks ago for an episode of heavy postmenopausal heavy vaginal bleeding with severe anemia (hgb 5.2 on admission).  Patient also with significant h/o bilateral DVTs (diagnosed in January 2017), for which she was being treated with Alen Blew, and hypothyroidism on Synthroid.  While inpatient, patient received a pelvic ultrasound which revealed a diagnosis of uterine enlargement secondary to fibroids (patient reports that she was unaware of fibroids). Received 2 units of PRBCs and was discontinued from her Alen Blew (suspected cause of PMB).  Had an IVC filter placed during recent admission. Patient today notes that she is feeling well, has had no further episodes of bleeding since admission.   Pertinent Gynecological History: Menarche age: 55  Menses: post-menopausal since age 54, however notes the occasional episode of a few spots of blood "here and there" over the course of the years.  DES exposure: denies Sexually transmitted diseases: no past history Previous GYN Procedures: none  Last pap: normal Date: 10/2014 (per patient), performed at [facility] OB History: G0, P0  Past Medical History  Diagnosis Date  . Hypertension   . Hyperlipidemia   . Depression   . Hypothyroidism   . Anxiety   . Shortness of breath dyspnea   . DVT (deep venous thrombosis) (HCC)     Family History  Problem Relation Age of Onset  . Hypertension Mother     Past Surgical History  Procedure Laterality Date  . Knee surgery Left 1993  . Hernia repair    . Anterior lumbar disc arthroplasty N/A 06/04/2015    Procedure: L4-5 L5-S1 Lumbar artificial disc replacement with Dr. Scot Dock for approach;  Surgeon: Kristeen Miss, MD;  Location: [facility] NEURO ORS;  Service: Neurosurgery;   Laterality: N/A;  L4-5 L5-S1 Lumbar artificial disc replacement with Dr. Scot Dock for approach  . Abdominal exposure N/A 06/04/2015    Procedure: ABDOMINAL EXPOSURE;  Surgeon: Angelia Mould, MD;  Location: [facility] NEURO ORS;  Service: Vascular;  Laterality: N/A;  ABDOMINAL EXPOSURE  . Back surgery    . Peripheral vascular catheterization N/A 08/27/2015    Procedure: IVC Filter Insertion;  Surgeon: Katha Cabal, MD;  Location: [place] [facility] CV LAB;  Service: Cardiovascular;  Laterality: N/A;   Social History   Social History  . Marital Status: Single    Spouse Name: N/A  . Number of Children: N/A  . Years of Education: N/A   Occupational History  . Not on file.   Social History Main Topics  . Smoking status: Never Smoker   . Smokeless tobacco: Never Used  . Alcohol Use: Yes     Comment: wwekly  . Drug Use: No  . Sexual Activity: Not on file   Other Topics Concern  . Not on file   Social History Narrative    Current Outpatient Prescriptions on File Prior to Visit  Medication Sig Dispense Refill  . ALPRAZolam (XANAX) 1 MG tablet Take 0.5 mg by mouth 2 (two) times daily as needed for anxiety. No frequency of medications is noted in list provided by Dr Landis Gandy  [facility]    . busPIRone (BUSPAR) 10 MG tablet Take 10 mg by mouth 2 (two) times daily.    . citalopram (CELEXA) 20 MG tablet  Take 20 mg by mouth every morning.  3  . cyanocobalamin 1000 MCG tablet Take 1,000 mcg by mouth daily.    . diazepam (VALIUM) 5 MG tablet Take 1 tablet (5 mg total) by mouth every 6 (six) hours as needed for muscle spasms. 60 tablet 0  . ferrous sulfate 325 (65 FE) MG tablet Take 325 mg by mouth daily.  1  . fluticasone (FLONASE) 50 MCG/ACT nasal spray Place 1 spray into both nostrils as needed for allergies.     . Hydrocodone-Acetaminophen 5-300 MG TABS Take 1 tablet by mouth every 6 (six) hours as needed. for pain  0  . levothyroxine (SYNTHROID, LEVOTHROID) 50 MCG  tablet Take 50 mcg by mouth daily before breakfast.    . lisinopril-hydrochlorothiazide (PRINZIDE,ZESTORETIC) 20-25 MG tablet Take 1 tablet by mouth daily.     . medroxyPROGESTERone (PROVERA) 10 MG tablet Take 1 tablet (10 mg total) by mouth daily. 9 tablet 0  . Multiple Vitamins-Minerals (COMPLETE MULTIVITAMIN/MINERAL PO) Take 1 tablet by mouth every morning.    . Omega-3 Fatty Acids (FISH OIL) 600 MG CAPS Take 600 mg by mouth daily.    . pantoprazole (PROTONIX) 40 MG tablet Take 1 tablet (40 mg total) by mouth daily. 30 tablet 0  . zolpidem (AMBIEN) 10 MG tablet Take 10 mg by mouth at bedtime.      No current facility-administered medications on file prior to visit.    No Known Allergies   Review of Systems Pertinent items noted in HPI and remainder of comprehensive ROS otherwise negative.   Objective:   Blood pressure 119/76, pulse 80, height 5' 4.5" (1.638 m), weight 138 lb 12.8 oz (62.959 kg), last menstrual period 08/23/2015. General appearance: alert and no distress Abdomen: soft, non-tender; bowel sounds normal; no masses,  no organomegaly Pelvic: External genitalia normal.  Vagina with normal but mildly atrophic vaginal mucosa. Cervix visible, no lesions, nulliparous. Bimanual with enlarged uterus, ~ 14-16 week size, non-mobile. Adnexae non palpable, no masses or tenderness.   Extremities: extremities normal, atraumatic, no cyanosis or edema Neurologic: Grossly normal   Labs:   Results for DEL, GIM (MRN [id number]) as of 09/16/2015 22:17  Ref. Range 08/25/2015 13:21 08/26/2015 02:20  Hemoglobin Latest Ref Range: 12.0-16.0 g/dL 5.2 (L) 7.6 (L)  HCT Latest Ref Range: 35.0-47.0 % 16.3 (L)     Lab Results  Component Value Date   TSH 2.113 08/27/2015    Imaging (Pelvic Ultrasound 08/26/2015):  FINDINGS: Uterus - Measurements: 13.7 x 7.7 x 10.4 cm. Bulky uterine fibroids, up to 7.8 cm diameter individually. The largest at the fundus appears intramural or sub  serosal. There is a sub serosal fibroid along the left cornu or body measuring 4.7 cm diameter.  Endometrium - Thickness: Not visible, suspected to be distorted due to the multiple fibroids.  Right ovary - Measurements: 2.4 x 1.6 x 1.8 cm. Normal appearance/no adnexal mass.  Left ovary - Measurements: Not visualized.  Other findings - No abnormal free fluid.  IMPRESSION: 1. Fibroid uterus, with multiple lesions up to 7.8 cm individually.  These appear to distort the endometrium which is not reliably visible, and are considered a likely source of the intermittent bleeding. 2. Normal right ovary. Left ovary not visualized. 3. No pelvic free fluid.   Assessment:   Postmenopausal bleeding Fibroid uterus H/o DVT, previously on anticoagulants, no with IVC filter.   Plan:   - Reiterated etiologies of postmenopausal bleeding, concern about precancerous/hyperplasia or cancerous etiology (5 to 10%  percent of cases). Was likely secondary to medication Alen Blew), however due to patient's new diagnosis of fibroid uterus, and inability to measure endometrial stripe on ultrasound due to fibroid location, could rule out other causes such as polyp, malignancy.Thyroid labs normal.  Recommend an endometrial biopsy to assess for pathology of endometrium.  If benign, no further workup or treatment needed. Further diagnostic evaluation is indicated for recurrent or persistent bleeding.   - Will repeat Hgb as patient's last level was ~ 7 inpatient. Patient to continue taking PO iron supplementation.    Endometrial Biopsy Procedure Note  The patient is positioned on the exam table in the dorsal lithotomy position. Bimanual exam confirms uterine position and size. A Graves speculum is placed into the vagina. A single toothed tenaculum is placed onto the anterior lip of the cervix.  The cervical os was pinpoint, and cervical dilation was performed. The pipette is placed into the endocervical canal and is  advanced to the uterine fundus. Using a piston like technique, with vacuum created by withdrawing the stylus, the endometrial specimen is obtained and transferred to the biopsy container. Minimal bleeding is encountered. The procedure is well tolerated.   Uterine Position:mid    Uterine Length: 12 cm   Uterine Specimen: Average   Post procedure instructions are given. The patient is scheduled for follow up appointment.     Rubie Maid, MD [facility]

## 2015-09-17 ENCOUNTER — Telehealth: Payer: Self-pay

## 2015-09-17 NOTE — Telephone Encounter (Signed)
Called pt informed her of negative biopsy results and the need to continue iron supplements. Pt gave verbal understanding.

## 2015-09-17 NOTE — Telephone Encounter (Signed)
-----   Message from Rubie Maid, MD sent at 09/17/2015  8:20 AM EDT ----- Please inform patient that endometrial biopsy results are negative, no further workup or intervention needed for PMB. Still anemic, although has improved since hospitalization.  Should continue taking iron supplementation for an additional 4-6 weeks.

## 2015-11-11 ENCOUNTER — Encounter: Payer: Self-pay | Admitting: Vascular Surgery

## 2015-11-15 ENCOUNTER — Encounter: Payer: Self-pay | Admitting: Vascular Surgery

## 2015-11-15 ENCOUNTER — Ambulatory Visit (INDEPENDENT_AMBULATORY_CARE_PROVIDER_SITE_OTHER): Payer: Worker's Compensation | Admitting: Vascular Surgery

## 2015-11-15 VITALS — BP 122/86 | HR 96 | Temp 97.2°F | Resp 16 | Ht 65.0 in | Wt 137.0 lb

## 2015-11-15 DIAGNOSIS — Z48812 Encounter for surgical aftercare following surgery on the circulatory system: Secondary | ICD-10-CM

## 2015-11-15 NOTE — Progress Notes (Signed)
Patient name: Ashley Hurst MRN: [id number] DOB: 1960-10-25 Sex: female  REASON FOR VISIT: Incisional pain after ALIF on 06/04/2015.  HPI: Ashley Hurst is a 55 y.o. female who underwent ALIF at the L4-L5 and L5-S1 level on 06/04/2015. I provided anterior retroperitoneal exposure for Dr. Ellene Route. Of note the anatomy was somewhat unusual in that the L5-S1 space was higher than normal and the left common iliac vein was directly over the disc space. Therefore I felt it would be difficult to obtain exposure from below the bifurcation. We therefore dressed the L4-L5 disc space and the L5-S1 disc space from above the bifurcation.  The patient had a venous duplex can on 06/14/2015 which showed bilateral lower extremity DVTs and the patient has been treated with Xarelto.  She subsequently developed significant vaginal bleeding. Therefore, she had an IVC filter placed on 08/27/2015. She comes in today she's been having significant incisional pain since her operation.  She states that the pain feels fairly deep down. She has not noticed any bulging in this area. She denies fever or chills. She's had no problems with the incision in terms of drainage or erythema.  Current Outpatient Prescriptions  Medication Sig Dispense Refill  . ALPRAZolam (XANAX) 1 MG tablet Take 0.5 mg by mouth 2 (two) times daily as needed for anxiety. No frequency of medications is noted in list provided by Dr Landis Gandy  [facility]    . busPIRone (BUSPAR) 10 MG tablet Take 10 mg by mouth 2 (two) times daily.    . citalopram (CELEXA) 20 MG tablet Take 20 mg by mouth every morning.  3  . cyanocobalamin 1000 MCG tablet Take 1,000 mcg by mouth daily.    . diazepam (VALIUM) 5 MG tablet Take 1 tablet (5 mg total) by mouth every 6 (six) hours as needed for muscle spasms. 60 tablet 0  . diclofenac (VOLTAREN) 75 MG EC tablet Take 75 mg by mouth 2 (two) times daily.  0  . ferrous sulfate 325 (65 FE) MG tablet Take 325  mg by mouth daily.  1  . fluticasone (FLONASE) 50 MCG/ACT nasal spray Place 1 spray into both nostrils as needed for allergies.     Marland Kitchen levothyroxine (SYNTHROID, LEVOTHROID) 50 MCG tablet Take 50 mcg by mouth daily before breakfast.    . lisinopril-hydrochlorothiazide (PRINZIDE,ZESTORETIC) 20-25 MG tablet Take 1 tablet by mouth daily.     . Multiple Vitamins-Minerals (COMPLETE MULTIVITAMIN/MINERAL PO) Take 1 tablet by mouth every morning.    . Omega-3 Fatty Acids (FISH OIL) 600 MG CAPS Take 600 mg by mouth daily.    . pantoprazole (PROTONIX) 40 MG tablet Take 1 tablet (40 mg total) by mouth daily. 30 tablet 0  . XARELTO 20 MG TABS tablet TAKE 1 TABLET BY ORAL ROUTE EVERY DAY WITH THE EVENING MEAL  12  . zolpidem (AMBIEN) 10 MG tablet Take 10 mg by mouth at bedtime.      No current facility-administered medications for this visit.    REVIEW OF SYSTEMS:  [X]  denotes positive finding, [ ]  denotes negative finding Cardiac  Comments:  Chest pain or chest pressure:    Shortness of breath upon exertion:    Short of breath when lying flat:    Irregular heart rhythm:    Constitutional    Fever or chills:      PHYSICAL EXAM: Filed Vitals:   11/15/15 1347  BP: 122/86  Pulse: 96  Temp: 97.2 F (36.2 C)  TempSrc:  Oral  Resp: 16  Height: 5\' 5"  (1.651 m)  Weight: 137 lb (62.143 kg)  SpO2: 96%    GENERAL: The patient is a well-nourished female, in no acute distress. The vital signs are documented above. CARDIOVASCULAR: There is a regular rate and rhythm. PULMONARY: There is good air exchange bilaterally without wheezing or rales. Her incision looks fine without erythema or drainage. This has healed nicely. On exam, I do not appreciate a hernia. She appears to have a good healing ridge and I do not appreciate any problems with her wound. She currently has no significant lower extremity swelling.  MEDICAL ISSUES:  INCISIONAL PAIN: I do not see any problems with her incision on exam. She  does not show any signs of infection such as erythema or drainage. Incision is healed nicely. I do not appreciate a hernia. She seems quite concerned about this however and for this reason I have recommended that we obtain a CT scan of the abdomen to see if there is any hernia that cannot be appreciated on exam or any deep space hematoma or infection. I will see her back after her CT scan to review these results. She would like to have the scan done in [place] and I think this is reasonable.  [name] [facility] [place] [phone]

## 2015-11-19 ENCOUNTER — Other Ambulatory Visit: Payer: Self-pay | Admitting: *Deleted

## 2015-11-19 DIAGNOSIS — Z95828 Presence of other vascular implants and grafts: Secondary | ICD-10-CM

## 2015-11-19 DIAGNOSIS — Z48812 Encounter for surgical aftercare following surgery on the circulatory system: Secondary | ICD-10-CM

## 2015-11-19 DIAGNOSIS — R1030 Lower abdominal pain, unspecified: Secondary | ICD-10-CM

## 2015-11-27 ENCOUNTER — Telehealth: Payer: Self-pay | Admitting: *Deleted

## 2015-11-27 ENCOUNTER — Other Ambulatory Visit: Payer: Self-pay | Admitting: *Deleted

## 2015-11-27 DIAGNOSIS — R1032 Left lower quadrant pain: Secondary | ICD-10-CM

## 2015-11-27 DIAGNOSIS — Z48812 Encounter for surgical aftercare following surgery on the circulatory system: Secondary | ICD-10-CM

## 2015-11-27 NOTE — Telephone Encounter (Signed)
[place] [id number] is approved to do CTA of abdomen and pelvis. The pelvic scan was added after discussion with Dr. Scot Dock today. This was approved by Pilar Plate, Case Manager [id number]. I faxed this new order to [facility] at [phone].    Ashley Hurst told me that the patient saw Dr. Ellene Route today and is still having LLQ pain. She is at [facility] with Dr. Ellene Route and has 35% disability. Worker's Comp is wanting to get all scans done asap and will have [facility] send a new authorization to [place] for this CTA Abdomen and Pelvis. I have given all this information to [facility], [place], at Imaging. They are not associated with [facility] and do not have access to EPIC.

## 2015-12-06 ENCOUNTER — Encounter: Payer: Self-pay | Admitting: Vascular Surgery

## 2015-12-11 ENCOUNTER — Encounter: Payer: Self-pay | Admitting: Vascular Surgery

## 2015-12-11 ENCOUNTER — Ambulatory Visit (INDEPENDENT_AMBULATORY_CARE_PROVIDER_SITE_OTHER): Payer: Worker's Compensation | Admitting: Vascular Surgery

## 2015-12-11 VITALS — BP 128/90 | HR 80 | Temp 97.2°F | Resp 16

## 2015-12-11 DIAGNOSIS — M5137 Other intervertebral disc degeneration, lumbosacral region: Secondary | ICD-10-CM

## 2015-12-11 NOTE — Progress Notes (Signed)
Patient name: Ashley Hurst MRN: [id number] DOB: [birth year] Sex: female  REASON FOR VISIT: Follow up of incisional pain.  HPI: Ashley Hurst is a 55 y.o. female who underwent an ALIF at the L4-L5 and L5-S1 level on 06/04/2015. I provided anterior retroperitoneal exposure for Dr. Ellene Route. The patient subsequently developed bilateral lower extremity DVTs and has been treated with Alen Blew. She had been having some vaginal bleeding and therefore an IVC filter was placed by [facility] on 08/27/2015. When I last saw her on 11/15/2015 she was having incisional pain. Her exam was unimpressive and I did not palpate any hernia. She felt like pain was deeper down and for this reason I recommended a CT scan. The CT scan was done in [place]. I'm unable to review those images I have the report however. There is no mention of any problems with her incision from her ALIF. Note however, her infrarenal IVC filter is in place with several struts protruding through the vessel wall with a left strut abutting the abdominal aorta and an anterior strut abutting the duodenum.  Although I could not review the images of her CT scan, I did discuss the images with the radiologist who was viewing the images today. He did not see any problems at the site of her left paramedian incision. There was no abscess, hematoma, hernia, or significant other abnormality.  Current Outpatient Prescriptions  Medication Sig Dispense Refill  . ALPRAZolam (XANAX) 1 MG tablet Take 0.5 mg by mouth 2 (two) times daily as needed for anxiety. No frequency of medications is noted in list provided by Dr Landis Gandy  [facility]    . busPIRone (BUSPAR) 10 MG tablet Take 10 mg by mouth 2 (two) times daily.    . citalopram (CELEXA) 20 MG tablet Take 20 mg by mouth every morning.  3  . cyanocobalamin 1000 MCG tablet Take 1,000 mcg by mouth daily.    . diazepam (VALIUM) 5 MG tablet Take 1 tablet (5 mg total) by  mouth every 6 (six) hours as needed for muscle spasms. 60 tablet 0  . diclofenac (VOLTAREN) 75 MG EC tablet Take 75 mg by mouth 2 (two) times daily.  0  . ferrous sulfate 325 (65 FE) MG tablet Take 325 mg by mouth daily.  1  . fluticasone (FLONASE) 50 MCG/ACT nasal spray Place 1 spray into both nostrils as needed for allergies.     Marland Kitchen levothyroxine (SYNTHROID, LEVOTHROID) 50 MCG tablet Take 50 mcg by mouth daily before breakfast.    . lisinopril-hydrochlorothiazide (PRINZIDE,ZESTORETIC) 20-25 MG tablet Take 1 tablet by mouth daily.     . Multiple Vitamins-Minerals (COMPLETE MULTIVITAMIN/MINERAL PO) Take 1 tablet by mouth every morning.    . Omega-3 Fatty Acids (FISH OIL) 600 MG CAPS Take 600 mg by mouth daily.    . pantoprazole (PROTONIX) 40 MG tablet Take 1 tablet (40 mg total) by mouth daily. 30 tablet 0  . zolpidem (AMBIEN) 10 MG tablet Take 10 mg by mouth at bedtime.     Alveda Reasons 20 MG TABS tablet Reported on 12/11/2015  12   No current facility-administered medications for this visit.    REVIEW OF SYSTEMS:  [X]  denotes positive finding, [ ]  denotes negative finding Cardiac  Comments:  Chest pain or chest pressure:    Shortness of breath upon exertion:    Short of breath when lying flat:    Irregular heart rhythm:    Constitutional  Fever or chills:      PHYSICAL EXAM: Filed Vitals:   12/11/15 1551 12/11/15 1555  BP: 148/92 128/90  Pulse: 80 80  Temp: 97.2 F (36.2 C)   TempSrc: Oral   Resp: 16   SpO2: 97%     GENERAL: The patient is a well-nourished female, in no acute distress. The vital signs are documented above. CARDIOVASCULAR: There is a regular rate and rhythm. PULMONARY: There is good air exchange bilaterally without wheezing or rales. Her left paramedian incision is healing nicely. There is no evidence of hernia. There is no cellulitis or evidence of infection. There is no significant swelling.  CT SCAN: Results of her CT scan done at [facility] healthcare are  discussed above.  MEDICAL ISSUES:  STATUS POST ALIF: The patient continues to have incisional pain after her left paramedian incision for retroperitoneal exposure of L4-L5 and L5-S1. Her exam is unremarkable. Her CT scan does not show any problems at this level. I reassured her that there is no evidence of infection, abscess, hematoma, hernia, or other abnormality at the site of her incision.  An incidental finding on her CT scan was that there were several struts from her IVC filter protruding through the inferior vena cava as described above. She is scheduled to see the vascular surgeon who placed her filter next week. She will discuss these results with him.  [name] [facility] [place] [phone]

## 2015-12-17 ENCOUNTER — Other Ambulatory Visit: Payer: Self-pay | Admitting: Vascular Surgery

## 2015-12-24 ENCOUNTER — Other Ambulatory Visit: Payer: Self-pay | Admitting: Obstetrics and Gynecology

## 2015-12-24 DIAGNOSIS — Z1231 Encounter for screening mammogram for malignant neoplasm of breast: Secondary | ICD-10-CM

## 2015-12-24 DIAGNOSIS — Z13820 Encounter for screening for osteoporosis: Secondary | ICD-10-CM

## 2015-12-26 ENCOUNTER — Encounter
Admission: RE | Disposition: A | Discharge status: Home / Self Care | Payer: Self-pay | Source: Ambulatory Visit | Attending: Vascular Surgery

## 2015-12-26 ENCOUNTER — Ambulatory Visit
Admission: RE | Admit: 2015-12-26 | Discharge: 2015-12-26 | Disposition: A | Discharge status: Home / Self Care | Payer: Worker's Compensation | Source: Ambulatory Visit | Attending: Vascular Surgery | Admitting: Vascular Surgery

## 2015-12-26 DIAGNOSIS — K922 Gastrointestinal hemorrhage, unspecified: Secondary | ICD-10-CM | POA: Insufficient documentation

## 2015-12-26 DIAGNOSIS — M2560 Stiffness of unspecified joint, not elsewhere classified: Secondary | ICD-10-CM | POA: Diagnosis not present

## 2015-12-26 DIAGNOSIS — Z8249 Family history of ischemic heart disease and other diseases of the circulatory system: Secondary | ICD-10-CM | POA: Diagnosis not present

## 2015-12-26 DIAGNOSIS — R531 Weakness: Secondary | ICD-10-CM | POA: Diagnosis not present

## 2015-12-26 DIAGNOSIS — Z452 Encounter for adjustment and management of vascular access device: Secondary | ICD-10-CM | POA: Insufficient documentation

## 2015-12-26 DIAGNOSIS — M79609 Pain in unspecified limb: Secondary | ICD-10-CM | POA: Diagnosis not present

## 2015-12-26 DIAGNOSIS — E7800 Pure hypercholesterolemia, unspecified: Secondary | ICD-10-CM | POA: Diagnosis not present

## 2015-12-26 DIAGNOSIS — I10 Essential (primary) hypertension: Secondary | ICD-10-CM | POA: Insufficient documentation

## 2015-12-26 DIAGNOSIS — I82409 Acute embolism and thrombosis of unspecified deep veins of unspecified lower extremity: Secondary | ICD-10-CM | POA: Diagnosis not present

## 2015-12-26 DIAGNOSIS — Z95828 Presence of other vascular implants and grafts: Secondary | ICD-10-CM | POA: Diagnosis not present

## 2015-12-26 HISTORY — PX: PERIPHERAL VASCULAR CATHETERIZATION: SHX172C

## 2015-12-26 SURGERY — IVC FILTER REMOVAL
Anesthesia: Moderate Sedation

## 2015-12-26 MED ORDER — HYDROMORPHONE HCL 1 MG/ML IJ SOLN: 1.0000 mg | Freq: Once | INTRAMUSCULAR | Status: DC

## 2015-12-26 MED ORDER — HYDRALAZINE HCL 20 MG/ML IJ SOLN: 5.0000 mg | INTRAMUSCULAR | Status: DC | PRN

## 2015-12-26 MED ORDER — DEXTROSE 5 % IV SOLN: 1.5000 g | INTRAVENOUS | Status: DC

## 2015-12-26 MED ORDER — ALUM & MAG HYDROXIDE-SIMETH 200-200-20 MG/5ML PO SUSP: 15.0000 mL | ORAL | Status: DC | PRN

## 2015-12-26 MED ORDER — ONDANSETRON HCL 4 MG/2ML IJ SOLN: 4.0000 mg | Freq: Four times a day (QID) | INTRAMUSCULAR | Status: DC | PRN

## 2015-12-26 MED ORDER — FAMOTIDINE 20 MG PO TABS: 40.0000 mg | ORAL_TABLET | ORAL | Status: DC | PRN

## 2015-12-26 MED ORDER — METHYLPREDNISOLONE SODIUM SUCC 125 MG IJ SOLR: 125.0000 mg | INTRAMUSCULAR | Status: DC | PRN

## 2015-12-26 MED ORDER — MORPHINE SULFATE (PF) 4 MG/ML IV SOLN: 2.0000 mg | INTRAVENOUS | Status: DC | PRN

## 2015-12-26 MED ORDER — MIDAZOLAM HCL 2 MG/2ML IJ SOLN
INTRAMUSCULAR | Status: AC
  Filled 2015-12-26: qty 2

## 2015-12-26 MED ORDER — MIDAZOLAM HCL 2 MG/2ML IJ SOLN
INTRAMUSCULAR | Status: DC | PRN
  Administered 2015-12-26 (×2): 2 mg via INTRAVENOUS

## 2015-12-26 MED ORDER — METOPROLOL TARTRATE 5 MG/5ML IV SOLN: 2.0000 mg | INTRAVENOUS | Status: DC | PRN

## 2015-12-26 MED ORDER — ACETAMINOPHEN 325 MG PO TABS: 325.0000 mg | ORAL_TABLET | ORAL | Status: DC | PRN

## 2015-12-26 MED ORDER — OXYCODONE-ACETAMINOPHEN 5-325 MG PO TABS: 1.0000 | ORAL_TABLET | ORAL | Status: DC | PRN

## 2015-12-26 MED ORDER — SODIUM CHLORIDE 0.9 % IV SOLN
INTRAVENOUS | Status: DC
  Administered 2015-12-26: 08:00:00 via INTRAVENOUS

## 2015-12-26 MED ORDER — ACETAMINOPHEN 325 MG RE SUPP: 325.0000 mg | RECTAL | Status: DC | PRN

## 2015-12-26 MED ORDER — FENTANYL CITRATE (PF) 100 MCG/2ML IJ SOLN
INTRAMUSCULAR | Status: DC | PRN
  Administered 2015-12-26 (×2): 50 ug via INTRAVENOUS

## 2015-12-26 MED ORDER — LIDOCAINE-EPINEPHRINE (PF) 1 %-1:200000 IJ SOLN
INTRAMUSCULAR | Status: AC
  Filled 2015-12-26: qty 30

## 2015-12-26 MED ORDER — FENTANYL CITRATE (PF) 100 MCG/2ML IJ SOLN
INTRAMUSCULAR | Status: AC
  Filled 2015-12-26: qty 2

## 2015-12-26 MED ORDER — PHENOL 1.4 % MT LIQD: 1.0000 | OROMUCOSAL | Status: DC | PRN

## 2015-12-26 MED ORDER — GUAIFENESIN-DM 100-10 MG/5ML PO SYRP
15.0000 mL | ORAL_SOLUTION | ORAL | Status: DC | PRN
Start: 2015-12-26 — End: 2015-12-26

## 2015-12-26 MED ORDER — SODIUM CHLORIDE 0.9 % IV SOLN: 500.0000 mL | Freq: Once | INTRAVENOUS | Status: DC | PRN

## 2015-12-26 MED ORDER — LABETALOL HCL 5 MG/ML IV SOLN: 10.0000 mg | INTRAVENOUS | Status: DC | PRN

## 2015-12-26 SURGICAL SUPPLY — 3 items
PACK ANGIOGRAPHY (CUSTOM PROCEDURE TRAY) ×3 IMPLANT
SET VENACAVA FILTER RETRIEVAL (MISCELLANEOUS) ×3 IMPLANT
WIRE J 3MM .035X145CM (WIRE) ×3 IMPLANT

## 2015-12-26 NOTE — Op Note (Signed)
[facility]   OPERATIVE NOTE    PRE-OPERATIVE DIAGNOSIS:  1. DVT 2. status post IVC filter placement  POST-OPERATIVE DIAGNOSIS: Same as above  PROCEDURE: 1. Ultrasound guidance for vascular access right jugular vein 2. Catheter placement into inferior vena cava from right jugular vein 3. Inferior venacavogram 4. Retrieval of Cook Celect IVC filter  SURGEON: Leotis Pain, MD  ASSISTANT(S): None  ANESTHESIA: Local with moderate conscious sedation for approximately 15 minutes using 4 mg of Versed and 100 mcg of Fentanyl  ESTIMATED BLOOD LOSS: 10 cc  FINDING(S): 1. Patent IVC  SPECIMEN(S): IVC filter  INDICATIONS:  Patient is a 55 year old female who presents with a previous history of IVC filter placement. Patient has remained on anticoagulation for several months and no longer needs this filter. The patient remains on anticoagulation. Risks and benefits were discussed, and informed consent was obtained.  DESCRIPTION: After obtaining full informed written consent, the patient was brought back to the vascular suite and placed supine upon the table.Moderate conscious sedation was administered during a face to face encounter with the patient throughout the procedure with my supervision of the RN administering medicines and monitoring the patient's vital signs, pulse oximetry, telemetry and mental status throughout from the start of the procedure until the patient was taken to the recovery room.  After obtaining adequate anesthesia, the patient was prepped and draped in the standard fashion. The right jugular vein was visualized with ultrasound and found to be widely patent. It was then accessed under direct ultrasound guidance without difficulty with the Seldinger needle and a permanent image was recorded. A J-wire was placed. After skin nick and dilatation, the retrieval sheath was placed over the wire and advanced into the inferior vena cava. Inferior  vena cava was imaged and found to be widely patent on inferior venacavogram. The filter was straight in its orientation. The retrieval snare was then placed through the sheath and the hook of the filter was snared without difficulty. The sheath was then advanced, and the filter was collapsed and brought into the sheath in its entirety. It was then removed from the body in its entirety. The retrieval sheath was then removed. Pressure was held at the access site and sterile dressing was placed. The patient was taken to the recovery room in stable condition having tolerated the procedure well.  COMPLICATIONS: None  CONDITION: Stable   Mallory Schaad 12/26/2015 8:32 AM

## 2015-12-26 NOTE — H&P (Signed)
  [facility] History & Physical Update  The patient was interviewed and re-examined.  The patient's previous History and Physical has been reviewed and is unchanged.  There is no change in the plan of care. We plan to proceed with the scheduled procedure.  Caci Orren, MD  12/26/2015, 8:05 AM

## 2015-12-26 NOTE — Discharge Instructions (Signed)

## 2015-12-27 ENCOUNTER — Encounter: Payer: Self-pay | Admitting: Vascular Surgery

## 2016-01-09 ENCOUNTER — Ambulatory Visit: Admission: RE | Admit: 2016-01-09 | Payer: Worker's Compensation | Source: Ambulatory Visit

## 2016-01-14 ENCOUNTER — Ambulatory Visit
Admission: RE | Admit: 2016-01-14 | Discharge: 2016-01-14 | Disposition: A | Discharge status: Home / Self Care | Payer: 59 | Source: Ambulatory Visit | Attending: Obstetrics and Gynecology | Admitting: Obstetrics and Gynecology

## 2016-01-14 DIAGNOSIS — Z1231 Encounter for screening mammogram for malignant neoplasm of breast: Secondary | ICD-10-CM | POA: Diagnosis present

## 2016-01-14 DIAGNOSIS — R928 Other abnormal and inconclusive findings on diagnostic imaging of breast: Secondary | ICD-10-CM | POA: Diagnosis not present

## 2016-01-15 ENCOUNTER — Other Ambulatory Visit: Payer: Self-pay | Admitting: Obstetrics and Gynecology

## 2016-01-15 DIAGNOSIS — N6320 Unspecified lump in the left breast, unspecified quadrant: Secondary | ICD-10-CM

## 2016-01-24 ENCOUNTER — Ambulatory Visit
Admission: RE | Admit: 2016-01-24 | Discharge: 2016-01-24 | Disposition: A | Discharge status: Home / Self Care | Payer: 59 | Source: Ambulatory Visit | Attending: Obstetrics and Gynecology | Admitting: Obstetrics and Gynecology

## 2016-01-24 DIAGNOSIS — N6002 Solitary cyst of left breast: Secondary | ICD-10-CM | POA: Insufficient documentation

## 2016-01-24 DIAGNOSIS — N6320 Unspecified lump in the left breast, unspecified quadrant: Secondary | ICD-10-CM

## 2016-01-24 DIAGNOSIS — N63 Unspecified lump in breast: Secondary | ICD-10-CM | POA: Diagnosis present

## 2016-01-30 ENCOUNTER — Encounter: Payer: Self-pay | Admitting: Vascular Surgery

## 2017-01-13 IMAGING — RF DG C-ARM 61-120 MIN
1 series · 3 of 3 positions shown · non-contrast
Comparison: Preoperative lumbar spine x-rays 01/02/2015 and lumbar
spine MRI 12/07/2014.

CLINICAL DATA: L4-5 and L5-S1 artificial disc placement.

EXAM:
Operative LUMBAR SPINE - 2-3 VIEW

[Series 1: run · 3 of 3 slices shown]
[im 1/3]
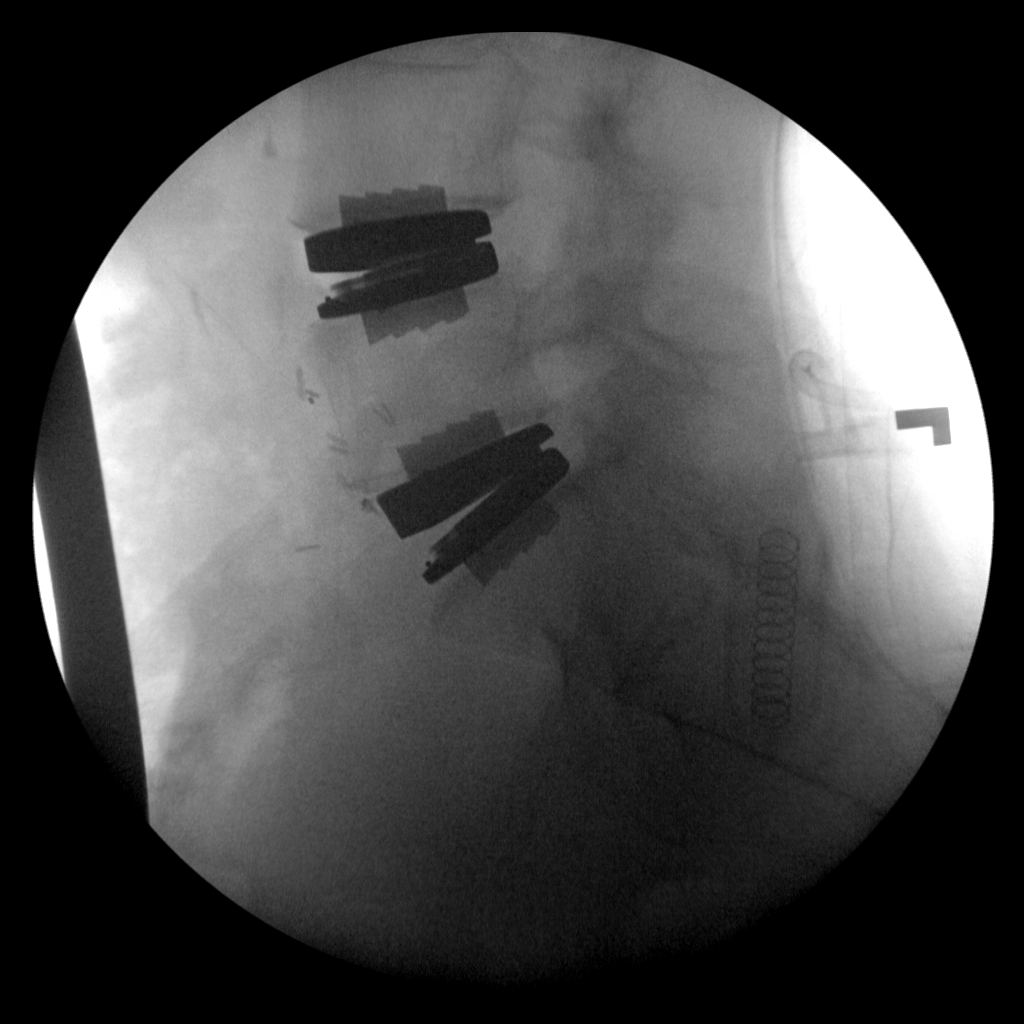
[im 2/3]
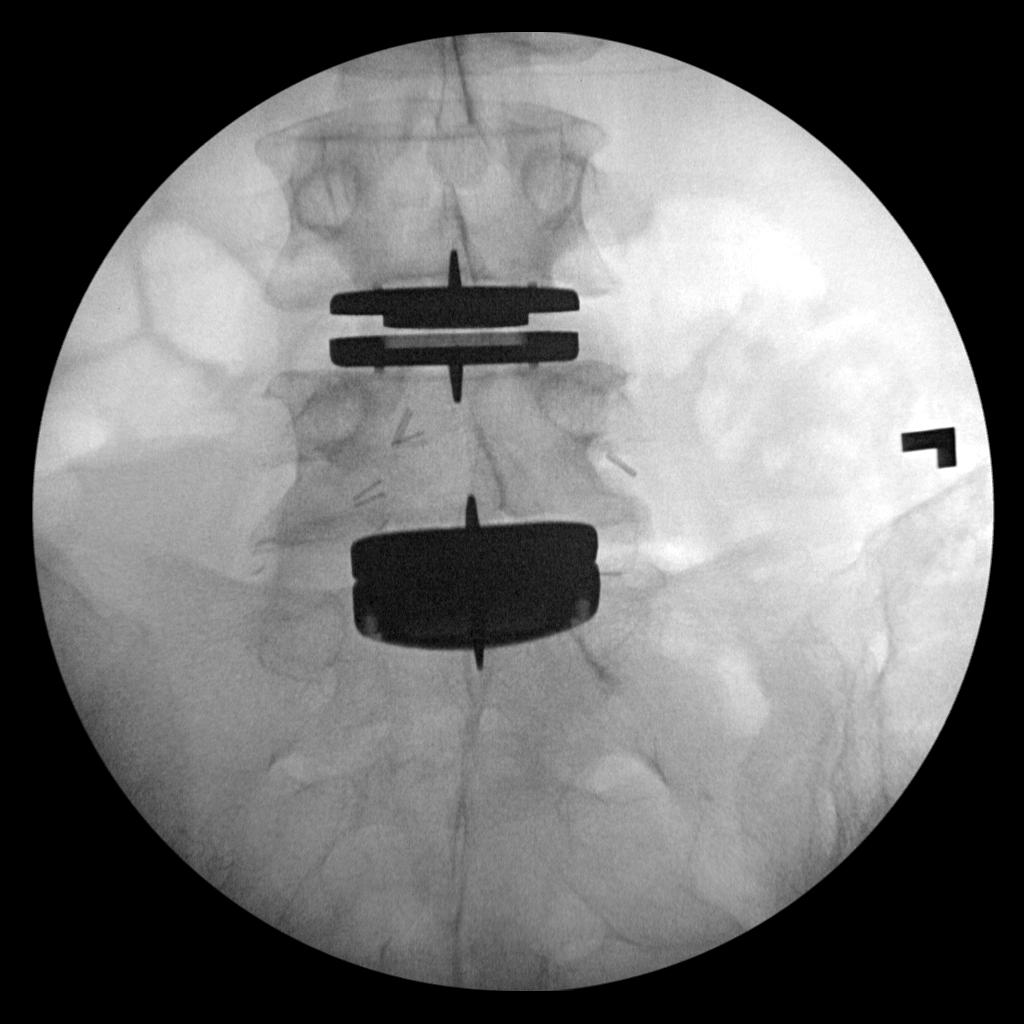
[im 3/3]
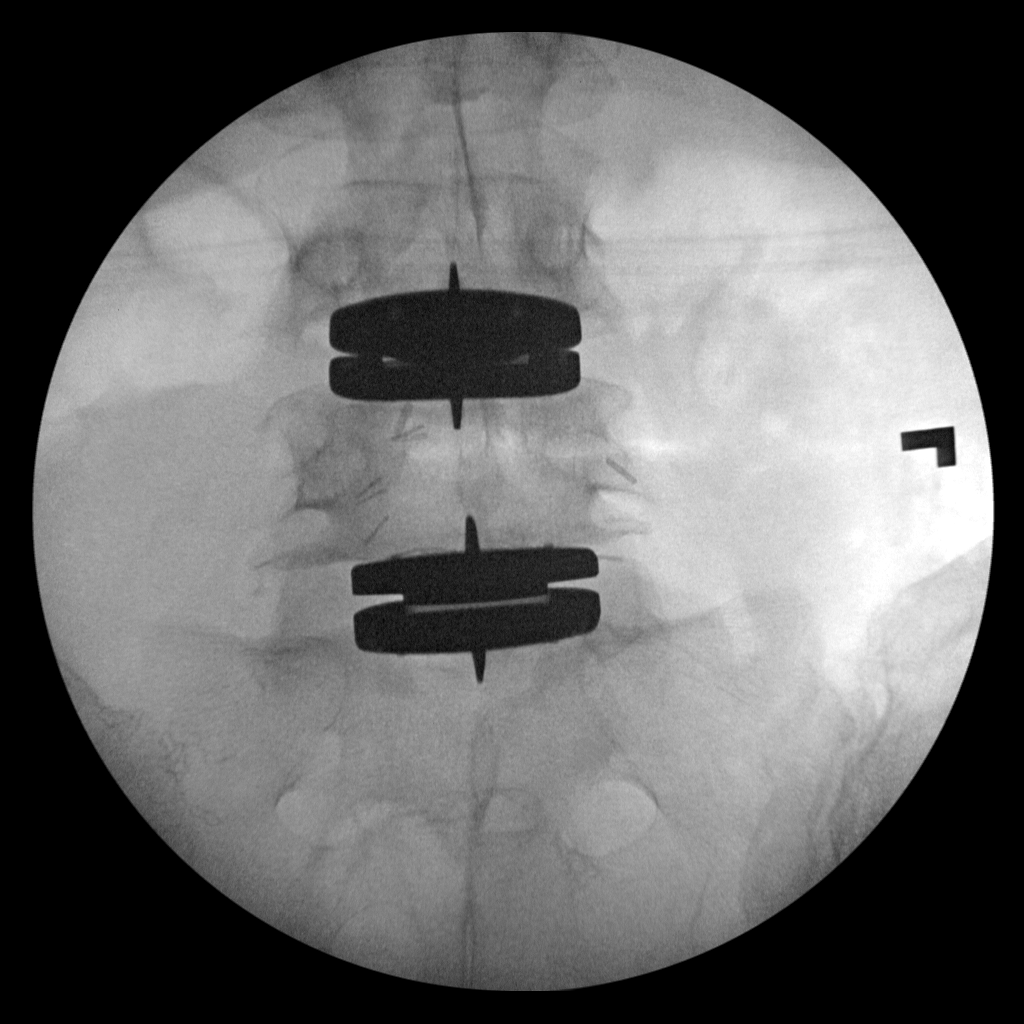

[3 of 3 positions shown; findings below may reference images not displayed]

FINDINGS: Three spot images from the C-arm fluoroscopic device, AP and lateral
views of the lower lumbar spine were submitted for interpretation
postoperatively. Disc prostheses are present at L4-5 and L5-S1,
appropriately positioned in the disc spaces. Posterior alignment
appears anatomic.

The radiologic technologist documented 1 min 28 sec of fluoroscopy
time.
IMPRESSION: Anatomic alignment post L4-5 and L5-S1 artificial disc placement.

## 2017-01-27 ENCOUNTER — Other Ambulatory Visit: Payer: Self-pay | Admitting: Obstetrics and Gynecology

## 2017-01-27 DIAGNOSIS — Z1231 Encounter for screening mammogram for malignant neoplasm of breast: Secondary | ICD-10-CM

## 2017-03-23 ENCOUNTER — Ambulatory Visit
Admission: RE | Admit: 2017-03-23 | Discharge: 2017-03-23 | Disposition: A | Discharge status: Home / Self Care | Payer: 59 | Source: Ambulatory Visit | Attending: Obstetrics and Gynecology | Admitting: Obstetrics and Gynecology

## 2017-03-23 DIAGNOSIS — Z1231 Encounter for screening mammogram for malignant neoplasm of breast: Secondary | ICD-10-CM | POA: Insufficient documentation

## 2017-08-20 ENCOUNTER — Ambulatory Visit
Admission: RE | Admit: 2017-08-20 | Discharge: 2017-08-20 | Disposition: A | Discharge status: Home / Self Care | Payer: Disability Insurance | Source: Ambulatory Visit | Attending: General Practice | Admitting: General Practice

## 2017-08-20 ENCOUNTER — Other Ambulatory Visit: Payer: Self-pay | Admitting: General Practice

## 2017-08-20 DIAGNOSIS — M7061 Trochanteric bursitis, right hip: Secondary | ICD-10-CM | POA: Diagnosis not present

## 2017-08-20 DIAGNOSIS — M7062 Trochanteric bursitis, left hip: Secondary | ICD-10-CM | POA: Insufficient documentation

## 2017-08-20 DIAGNOSIS — M24852 Other specific joint derangements of left hip, not elsewhere classified: Secondary | ICD-10-CM | POA: Diagnosis not present

## 2017-08-20 DIAGNOSIS — M24851 Other specific joint derangements of right hip, not elsewhere classified: Secondary | ICD-10-CM | POA: Insufficient documentation

## 2018-03-29 DIAGNOSIS — Z1329 Encounter for screening for other suspected endocrine disorder: Secondary | ICD-10-CM | POA: Diagnosis not present

## 2018-03-29 DIAGNOSIS — I10 Essential (primary) hypertension: Secondary | ICD-10-CM | POA: Diagnosis not present

## 2018-03-29 DIAGNOSIS — Z131 Encounter for screening for diabetes mellitus: Secondary | ICD-10-CM | POA: Diagnosis not present

## 2018-03-29 DIAGNOSIS — E559 Vitamin D deficiency, unspecified: Secondary | ICD-10-CM | POA: Diagnosis not present

## 2018-03-29 DIAGNOSIS — Z136 Encounter for screening for cardiovascular disorders: Secondary | ICD-10-CM | POA: Diagnosis not present

## 2018-03-29 DIAGNOSIS — N766 Ulceration of vulva: Secondary | ICD-10-CM | POA: Diagnosis not present

## 2018-03-29 DIAGNOSIS — Z01419 Encounter for gynecological examination (general) (routine) without abnormal findings: Secondary | ICD-10-CM | POA: Diagnosis not present

## 2018-03-29 DIAGNOSIS — Z1321 Encounter for screening for nutritional disorder: Secondary | ICD-10-CM | POA: Diagnosis not present

## 2018-03-29 DIAGNOSIS — Z124 Encounter for screening for malignant neoplasm of cervix: Secondary | ICD-10-CM | POA: Diagnosis not present

## 2018-03-29 DIAGNOSIS — Z113 Encounter for screening for infections with a predominantly sexual mode of transmission: Secondary | ICD-10-CM | POA: Diagnosis not present

## 2018-03-29 DIAGNOSIS — Z13220 Encounter for screening for lipoid disorders: Secondary | ICD-10-CM | POA: Diagnosis not present

## 2018-03-30 ENCOUNTER — Other Ambulatory Visit: Payer: Self-pay | Admitting: Obstetrics and Gynecology

## 2018-03-30 DIAGNOSIS — Z1231 Encounter for screening mammogram for malignant neoplasm of breast: Secondary | ICD-10-CM

## 2018-04-12 ENCOUNTER — Ambulatory Visit
Admission: RE | Admit: 2018-04-12 | Discharge: 2018-04-12 | Disposition: A | Discharge status: Home / Self Care | Payer: Medicare HMO | Source: Ambulatory Visit | Attending: Obstetrics and Gynecology | Admitting: Obstetrics and Gynecology

## 2018-04-12 ENCOUNTER — Encounter (INDEPENDENT_AMBULATORY_CARE_PROVIDER_SITE_OTHER): Payer: Self-pay

## 2018-04-12 DIAGNOSIS — Z1231 Encounter for screening mammogram for malignant neoplasm of breast: Secondary | ICD-10-CM | POA: Insufficient documentation

## 2018-04-13 DIAGNOSIS — E280 Estrogen excess: Secondary | ICD-10-CM | POA: Diagnosis not present

## 2018-04-13 DIAGNOSIS — Z780 Asymptomatic menopausal state: Secondary | ICD-10-CM | POA: Diagnosis not present

## 2018-04-13 DIAGNOSIS — M85832 Other specified disorders of bone density and structure, left forearm: Secondary | ICD-10-CM | POA: Diagnosis not present

## 2018-04-22 ENCOUNTER — Other Ambulatory Visit: Payer: Self-pay | Admitting: Obstetrics and Gynecology

## 2018-04-22 DIAGNOSIS — Z1231 Encounter for screening mammogram for malignant neoplasm of breast: Secondary | ICD-10-CM

## 2018-04-22 DIAGNOSIS — E2839 Other primary ovarian failure: Secondary | ICD-10-CM

## 2018-06-16 DIAGNOSIS — E559 Vitamin D deficiency, unspecified: Secondary | ICD-10-CM | POA: Diagnosis not present

## 2018-06-16 DIAGNOSIS — E032 Hypothyroidism due to medicaments and other exogenous substances: Secondary | ICD-10-CM | POA: Diagnosis not present

## 2018-07-15 ENCOUNTER — Telehealth: Payer: Self-pay

## 2018-07-15 NOTE — Telephone Encounter (Signed)
Pt appears on [facility] Quality Report for [facility] but has never been seen in the office.  I called the pt to confirm her PCP, and she confirmed that she sees Dr. Elgie Collard as her PCP. VDM (DD)

## 2018-09-13 DIAGNOSIS — R7989 Other specified abnormal findings of blood chemistry: Secondary | ICD-10-CM | POA: Diagnosis not present

## 2018-09-13 DIAGNOSIS — R946 Abnormal results of thyroid function studies: Secondary | ICD-10-CM | POA: Diagnosis not present

## 2018-09-13 DIAGNOSIS — R945 Abnormal results of liver function studies: Secondary | ICD-10-CM | POA: Diagnosis not present

## 2018-09-13 DIAGNOSIS — E559 Vitamin D deficiency, unspecified: Secondary | ICD-10-CM | POA: Diagnosis not present

## 2018-09-13 DIAGNOSIS — R8279 Other abnormal findings on microbiological examination of urine: Secondary | ICD-10-CM | POA: Diagnosis not present

## 2019-03-17 ENCOUNTER — Other Ambulatory Visit: Payer: Self-pay | Admitting: Obstetrics and Gynecology

## 2019-03-17 DIAGNOSIS — Z1231 Encounter for screening mammogram for malignant neoplasm of breast: Secondary | ICD-10-CM

## 2019-03-27 ENCOUNTER — Other Ambulatory Visit: Payer: Self-pay

## 2019-03-27 DIAGNOSIS — Z20822 Contact with and (suspected) exposure to covid-19: Secondary | ICD-10-CM

## 2019-03-28 LAB — NOVEL CORONAVIRUS, NAA: SARS-CoV-2, NAA: NOT DETECTED

## 2019-03-29 ENCOUNTER — Telehealth: Payer: Self-pay | Admitting: General Practice

## 2019-03-29 NOTE — Telephone Encounter (Signed)
Negative COVID results given. Patient results "NOT Detected." Caller expressed understanding. ° °

## 2019-04-17 ENCOUNTER — Ambulatory Visit: Payer: 59

## 2019-04-19 ENCOUNTER — Encounter (INDEPENDENT_AMBULATORY_CARE_PROVIDER_SITE_OTHER): Payer: Self-pay

## 2019-04-19 ENCOUNTER — Other Ambulatory Visit: Payer: Self-pay

## 2019-04-19 ENCOUNTER — Ambulatory Visit
Admission: RE | Admit: 2019-04-19 | Discharge: 2019-04-19 | Disposition: A | Discharge status: Home / Self Care | Payer: Medicare Other | Source: Ambulatory Visit | Attending: Obstetrics and Gynecology | Admitting: Obstetrics and Gynecology

## 2019-04-19 DIAGNOSIS — Z1231 Encounter for screening mammogram for malignant neoplasm of breast: Secondary | ICD-10-CM

## 2019-12-26 ENCOUNTER — Other Ambulatory Visit: Payer: Self-pay

## 2019-12-26 ENCOUNTER — Telehealth (INDEPENDENT_AMBULATORY_CARE_PROVIDER_SITE_OTHER): Payer: Self-pay | Admitting: Gastroenterology

## 2019-12-26 DIAGNOSIS — Z1211 Encounter for screening for malignant neoplasm of colon: Secondary | ICD-10-CM

## 2019-12-26 NOTE — Progress Notes (Signed)
Gastroenterology Pre-Procedure Review  Request Date: Pending Requesting Physician: Dr. Dellie Catholic  PATIENT REVIEW QUESTIONS: The patient responded to the following health history questions as indicated:    1. Are you having any GI issues? no 2. Do you have a personal history of Polyps? no 3. Do you have a family history of Colon Cancer or Polyps? no 4. Diabetes Mellitus? no 5. Joint replacements in the past 12 months?no 6. Major health problems in the past 3 months?no 7. Any artificial heart valves, MVP, or defibrillator?no    MEDICATIONS & ALLERGIES:    Patient reports the following regarding taking any anticoagulation/antiplatelet therapy:   Plavix, Coumadin, Eliquis, Xarelto, Lovenox, Pradaxa, Brilinta, or Effient? no Aspirin? no  Patient confirms/reports the following medications:  Current Outpatient Medications  Medication Sig Dispense Refill  . ALPRAZolam (XANAX) 1 MG tablet Take 0.5 mg by mouth 2 (two) times daily as needed for anxiety. No frequency of medications is noted in list provided by Dr Landis Gandy  [facility]    . busPIRone (BUSPAR) 10 MG tablet Take 10 mg by mouth 2 (two) times daily.    . citalopram (CELEXA) 20 MG tablet Take 20 mg by mouth every morning.  3  . ferrous sulfate 325 (65 FE) MG tablet Take 325 mg by mouth daily.  1  . fluticasone (FLONASE) 50 MCG/ACT nasal spray Place 1 spray into both nostrils as needed for allergies.     Marland Kitchen levothyroxine (SYNTHROID, LEVOTHROID) 50 MCG tablet Take 50 mcg by mouth daily before breakfast.    . lisinopril-hydrochlorothiazide (PRINZIDE,ZESTORETIC) 20-25 MG tablet Take 1 tablet by mouth daily.     . Multiple Vitamins-Minerals (COMPLETE MULTIVITAMIN/MINERAL PO) Take 1 tablet by mouth every morning.    . traZODone (DESYREL) 100 MG tablet Take 100 mg by mouth at bedtime.    . diazepam (VALIUM) 5 MG tablet Take 1 tablet (5 mg total) by mouth every 6 (six) hours as needed for muscle spasms. (Patient not taking:  Reported on 12/26/2015) 60 tablet 0  . diclofenac (VOLTAREN) 75 MG EC tablet Take 75 mg by mouth 2 (two) times daily. (Patient not taking: Reported on 12/26/2019)  0   No current facility-administered medications for this visit.    Patient confirms/reports the following allergies:  No Known Allergies  No orders of the defined types were placed in this encounter.   AUTHORIZATION INFORMATION Primary Insurance: 1D#: Group #:  Secondary Insurance: 1D#: Group #:  SCHEDULE INFORMATION: Date: Patient to call back to schedule Time: Location:[facility]

## 2020-02-21 ENCOUNTER — Other Ambulatory Visit: Payer: Self-pay

## 2020-02-21 ENCOUNTER — Other Ambulatory Visit
Admission: RE | Admit: 2020-02-21 | Discharge: 2020-02-21 | Disposition: A | Discharge status: Home / Self Care | Payer: Medicare Other | Source: Ambulatory Visit | Attending: Gastroenterology | Admitting: Gastroenterology

## 2020-02-21 DIAGNOSIS — Z1321 Encounter for screening for nutritional disorder: Secondary | ICD-10-CM | POA: Diagnosis not present

## 2020-02-21 DIAGNOSIS — Z20822 Contact with and (suspected) exposure to covid-19: Secondary | ICD-10-CM | POA: Insufficient documentation

## 2020-02-21 DIAGNOSIS — Z01812 Encounter for preprocedural laboratory examination: Secondary | ICD-10-CM | POA: Insufficient documentation

## 2020-02-21 DIAGNOSIS — Z131 Encounter for screening for diabetes mellitus: Secondary | ICD-10-CM | POA: Diagnosis not present

## 2020-02-21 DIAGNOSIS — Z136 Encounter for screening for cardiovascular disorders: Secondary | ICD-10-CM | POA: Diagnosis not present

## 2020-02-21 DIAGNOSIS — Z1329 Encounter for screening for other suspected endocrine disorder: Secondary | ICD-10-CM | POA: Diagnosis not present

## 2020-02-21 DIAGNOSIS — Z13220 Encounter for screening for lipoid disorders: Secondary | ICD-10-CM | POA: Diagnosis not present

## 2020-02-21 DIAGNOSIS — I10 Essential (primary) hypertension: Secondary | ICD-10-CM | POA: Diagnosis not present

## 2020-02-22 ENCOUNTER — Encounter: Payer: Self-pay | Admitting: Gastroenterology

## 2020-02-22 LAB — SARS CORONAVIRUS 2 (TAT 6-24 HRS): SARS Coronavirus 2: NEGATIVE

## 2020-02-23 ENCOUNTER — Ambulatory Visit: Payer: Medicare Other | Admitting: Certified Registered Nurse Anesthetist

## 2020-02-23 ENCOUNTER — Encounter
Admission: RE | Disposition: A | Discharge status: Home / Self Care | Payer: Self-pay | Source: Home / Self Care | Attending: Gastroenterology

## 2020-02-23 ENCOUNTER — Ambulatory Visit
Admission: RE | Admit: 2020-02-23 | Discharge: 2020-02-23 | Disposition: A | Discharge status: Home / Self Care | Payer: Medicare Other | Attending: Gastroenterology | Admitting: Gastroenterology

## 2020-02-23 ENCOUNTER — Encounter: Payer: Self-pay | Admitting: Gastroenterology

## 2020-02-23 ENCOUNTER — Other Ambulatory Visit: Payer: Self-pay

## 2020-02-23 DIAGNOSIS — Z79899 Other long term (current) drug therapy: Secondary | ICD-10-CM | POA: Diagnosis not present

## 2020-02-23 DIAGNOSIS — Z5309 Procedure and treatment not carried out because of other contraindication: Secondary | ICD-10-CM | POA: Diagnosis not present

## 2020-02-23 DIAGNOSIS — E039 Hypothyroidism, unspecified: Secondary | ICD-10-CM | POA: Insufficient documentation

## 2020-02-23 DIAGNOSIS — I10 Essential (primary) hypertension: Secondary | ICD-10-CM | POA: Insufficient documentation

## 2020-02-23 DIAGNOSIS — F419 Anxiety disorder, unspecified: Secondary | ICD-10-CM | POA: Diagnosis not present

## 2020-02-23 DIAGNOSIS — E785 Hyperlipidemia, unspecified: Secondary | ICD-10-CM | POA: Diagnosis not present

## 2020-02-23 DIAGNOSIS — R0600 Dyspnea, unspecified: Secondary | ICD-10-CM | POA: Diagnosis not present

## 2020-02-23 DIAGNOSIS — Z8249 Family history of ischemic heart disease and other diseases of the circulatory system: Secondary | ICD-10-CM | POA: Diagnosis not present

## 2020-02-23 DIAGNOSIS — F329 Major depressive disorder, single episode, unspecified: Secondary | ICD-10-CM | POA: Diagnosis not present

## 2020-02-23 DIAGNOSIS — Z538 Procedure and treatment not carried out for other reasons: Secondary | ICD-10-CM | POA: Diagnosis not present

## 2020-02-23 DIAGNOSIS — Z1211 Encounter for screening for malignant neoplasm of colon: Secondary | ICD-10-CM | POA: Diagnosis not present

## 2020-02-23 DIAGNOSIS — R0602 Shortness of breath: Secondary | ICD-10-CM | POA: Diagnosis not present

## 2020-02-23 DIAGNOSIS — Z86718 Personal history of other venous thrombosis and embolism: Secondary | ICD-10-CM | POA: Insufficient documentation

## 2020-02-23 HISTORY — PX: COLONOSCOPY WITH PROPOFOL: SHX5780

## 2020-02-23 SURGERY — COLONOSCOPY WITH PROPOFOL
Anesthesia: General

## 2020-02-23 MED ORDER — LIDOCAINE HCL (CARDIAC) PF 100 MG/5ML IV SOSY
PREFILLED_SYRINGE | INTRAVENOUS | Status: DC | PRN
  Administered 2020-02-23: 50 mg via INTRAVENOUS

## 2020-02-23 MED ORDER — PROPOFOL 10 MG/ML IV BOLUS
INTRAVENOUS | Status: DC | PRN
  Administered 2020-02-23: 70 mg via INTRAVENOUS

## 2020-02-23 MED ORDER — PROPOFOL 500 MG/50ML IV EMUL
INTRAVENOUS | Status: DC | PRN
  Administered 2020-02-23: 150 ug/kg/min via INTRAVENOUS

## 2020-02-23 MED ORDER — SODIUM CHLORIDE 0.9 % IV SOLN: INTRAVENOUS | Status: DC

## 2020-02-23 NOTE — Transfer of Care (Signed)
Immediate Anesthesia Transfer of Care Note  Patient: Ashley Hurst  Procedure(s) Performed: COLONOSCOPY WITH PROPOFOL (N/A )  Patient Location: PACU  Anesthesia Type:General  Level of Consciousness: awake, alert  and oriented  Airway & Oxygen Therapy: Patient Spontanous Breathing  Post-op Assessment: Report given to RN and Post -op Vital signs reviewed and stable  Post vital signs: Reviewed and stable  Last Vitals:  Vitals Value Taken Time  BP    Temp    Pulse    Resp    SpO2      Last Pain:  Vitals:   02/23/20 0948  TempSrc: Temporal  PainSc: 0-No pain         Complications: No complications documented.

## 2020-02-23 NOTE — Op Note (Signed)
[facility] Gastroenterology Patient Name: Ashley Hurst Procedure Date: 02/23/2020 10:09 AM MRN: [id number] Account #: [id number] Date of Birth: [birth year] Admit Type: Outpatient Age: 59 Room: [facility] ENDO ROOM 4 Gender: Female Note Status: Finalized Procedure:             Colonoscopy Indications:           Screening for colorectal malignant neoplasm Providers:             Jonathon Bellows MD, MD Referring MD:          Shelby Mattocks. Georga Bora, MD (Referring MD) Medicines:             Monitored Anesthesia Care Complications:         No immediate complications. Procedure:             Pre-Anesthesia Assessment:                        - Prior to the procedure, a History and Physical was                         performed, and patient medications, allergies and                         sensitivities were reviewed. The patient's tolerance                         of previous anesthesia was reviewed.                        - The risks and benefits of the procedure and the                         sedation options and risks were discussed with the                         patient. All questions were answered and informed                         consent was obtained.                        - ASA Grade Assessment: II - A patient with mild                         systemic disease.                        After obtaining informed consent, the colonoscope was                         passed under direct vision. Throughout the procedure,                         the patient's blood pressure, pulse, and oxygen                         saturations were monitored continuously. The                         Colonoscope was introduced through the  anus with the                         intention of advancing to the cecum. The scope was                         advanced to the sigmoid colon before the procedure was                         aborted. Medications were given. The colonoscopy was                          performed with ease. The patient tolerated the                         procedure well. The quality of the bowel preparation                         was inadequate. Findings:      Extensive amounts of semi-solid stool was found in the recto-sigmoid       colon, interfering with visualization. Impression:            - Preparation of the colon was inadequate.                        - Stool in the recto-sigmoid colon.                        - No specimens collected. Recommendation:        - Discharge patient to home (with escort).                        - Resume previous diet.                        - Continue present medications.                        - Repeat colonoscopy in 2 weeks because the bowel                         preparation was suboptimal. Procedure Code(s):     --- Professional ---                        [phone], 53, Colonoscopy, flexible; diagnostic,                         including collection of specimen(s) by brushing or                         washing, when performed (separate procedure) Diagnosis Code(s):     --- Professional ---                        Z12.11, Encounter for screening for malignant neoplasm                         of colon CPT copyright 2019 [employer]. All rights reserved. The codes documented in this report are preliminary and upon  coder review may  be revised to meet current compliance requirements. Jonathon Bellows, MD Jonathon Bellows MD, MD 02/23/2020 10:15:07 AM This report has been signed electronically. Number of Addenda: 0 Note Initiated On: 02/23/2020 10:09 AM Total Procedure Duration: 0 hours 0 minutes 5 seconds  Estimated Blood Loss:  Estimated blood loss: none.      [facility]

## 2020-02-23 NOTE — Anesthesia Preprocedure Evaluation (Signed)
Anesthesia Evaluation  Patient identified by MRN, date of birth, ID band Patient awake    Reviewed: Allergy & Precautions, H&P , NPO status , Patient's Chart, lab work & pertinent test results, reviewed documented beta blocker date and time   History of Anesthesia Complications Negative for: history of anesthetic complications  Airway Mallampati: II  TM Distance: >3 FB Neck ROM: full    Dental  (+) Dental Advidsory Given, Missing   Pulmonary neg pulmonary ROS,    Pulmonary exam normal breath sounds clear to auscultation       Cardiovascular Exercise Tolerance: Good hypertension, (-) angina(-) Past MI and (-) Cardiac Stents Normal cardiovascular exam(-) dysrhythmias (-) Valvular Problems/Murmurs Rhythm:regular Rate:Normal     Neuro/Psych PSYCHIATRIC DISORDERS Anxiety Depression negative neurological ROS     GI/Hepatic negative GI ROS, Neg liver ROS,   Endo/Other  neg diabetesHypothyroidism   Renal/GU negative Renal ROS  negative genitourinary   Musculoskeletal   Abdominal   Peds  Hematology negative hematology ROS (+)   Anesthesia Other Findings Past Medical History: No date: Anxiety No date: Depression No date: DVT (deep venous thrombosis) (HCC) No date: Hyperlipidemia No date: Hypertension No date: Hypothyroidism No date: Shortness of breath dyspnea   Reproductive/Obstetrics negative OB ROS                             Anesthesia Physical Anesthesia Plan  ASA: II  Anesthesia Plan: General   Post-op Pain Management:    Induction: Intravenous  PONV Risk Score and Plan: 3 and Propofol infusion and TIVA  Airway Management Planned: Natural Airway and Nasal Cannula  Additional Equipment:   Intra-op Plan:   Post-operative Plan:   Informed Consent: I have reviewed the patients History and Physical, chart, labs and discussed the procedure including the risks, benefits and  alternatives for the proposed anesthesia with the patient or authorized representative who has indicated his/her understanding and acceptance.     Dental Advisory Given  Plan Discussed with: Anesthesiologist, CRNA and Surgeon  Anesthesia Plan Comments:         Anesthesia Quick Evaluation

## 2020-02-23 NOTE — H&P (Signed)
Jonathon Bellows, MD [address] [place], [place], [place], [address] [address], [place], [place], [place], [address] Phone: [phone]  Fax: [phone]  Primary Care Physician:  Elgie Collard, MD   Pre-Procedure History & Physical: HPI:  Ashley Hurst is a 59 y.o. female is here for an colonoscopy.   Past Medical History:  Diagnosis Date  . Anxiety   . Depression   . DVT (deep venous thrombosis) ([place])   . Hyperlipidemia   . Hypertension   . Hypothyroidism   . Shortness of breath dyspnea     Past Surgical History:  Procedure Laterality Date  . ABDOMINAL EXPOSURE N/A 06/04/2015   Procedure: ABDOMINAL EXPOSURE;  Surgeon: Angelia Mould, MD;  Location: [facility] NEURO ORS;  Service: Vascular;  Laterality: N/A;  ABDOMINAL EXPOSURE  . ANTERIOR LUMBAR DISC ARTHROPLASTY N/A 06/04/2015   Procedure: L4-5 L5-S1 Lumbar artificial disc replacement with Dr. Scot Dock for approach;  Surgeon: Kristeen Miss, MD;  Location: [facility] NEURO ORS;  Service: Neurosurgery;  Laterality: N/A;  L4-5 L5-S1 Lumbar artificial disc replacement with Dr. Scot Dock for approach  . BACK SURGERY    . HERNIA REPAIR    . KNEE SURGERY Left 1993  . PERIPHERAL VASCULAR CATHETERIZATION N/A 08/27/2015   Procedure: IVC Filter Insertion;  Surgeon: Katha Cabal, MD;  Location: [facility];  Service: Cardiovascular;  Laterality: N/A;  . PERIPHERAL VASCULAR CATHETERIZATION N/A 12/26/2015   Procedure: IVC Filter Removal;  Surgeon: Algernon Huxley, MD;  Location: [facility] CV LAB;  Service: Cardiovascular;  Laterality: N/A;    Prior to Admission medications   Medication Sig Start Date End Date Taking? Authorizing Provider  busPIRone (BUSPAR) 10 MG tablet Take 10 mg by mouth 2 (two) times daily.   Yes [provider]  citalopram (CELEXA) 20 MG tablet Take 20 mg by mouth every morning. 05/02/15  Yes [provider]  clonazePAM (KLONOPIN) 1 MG tablet Take 1 mg by mouth 4 (four) times daily  as needed for anxiety.   Yes [provider]  ferrous sulfate 325 (65 FE) MG tablet Take 325 mg by mouth daily. 03/08/15  Yes [provider]  fluticasone (FLONASE) 50 MCG/ACT nasal spray Place 1 spray into both nostrils as needed for allergies.    Yes [provider]  levothyroxine (SYNTHROID, LEVOTHROID) 50 MCG tablet Take 50 mcg by mouth daily before breakfast.   Yes [provider]  lisinopril-hydrochlorothiazide (PRINZIDE,ZESTORETIC) 20-25 MG tablet Take 1 tablet by mouth daily.    Yes [provider]  Multiple Vitamins-Minerals (COMPLETE MULTIVITAMIN/MINERAL PO) Take 1 tablet by mouth every morning.   Yes [provider]  traZODone (DESYREL) 100 MG tablet Take 100 mg by mouth at bedtime.   Yes [provider]  ALPRAZolam Duanne Moron) 1 MG tablet Take 0.5 mg by mouth 2 (two) times daily as needed for anxiety. No frequency of medications is noted in list provided by Dr Landis Gandy  [facility] Patient not taking: Reported on 02/23/2020    [provider]    Allergies as of 12/27/2019  . (No Known Allergies)    Family History  Problem Relation Age of Onset  . Hypertension Mother   . Breast cancer Neg Hx     Social History   Socioeconomic History  . Marital status: Single    Spouse name: Not on file  . Number of children: Not on file  . Years of education: Not on file  .  Highest education level: Not on file  Occupational History  . Not on file  Tobacco Use  . Smoking status: Never Smoker  . Smokeless tobacco: Never Used  Vaping Use  . Vaping Use: Never used  Substance and Sexual Activity  . Alcohol use: Yes    Alcohol/week: 21.0 standard drinks    Types: 21 Cans of beer per week    Comment: weekly  . Drug use: No  . Sexual activity: Never  Other Topics Concern  . Not on file  Social History Narrative  . Not on file   Social Determinants of Health   Financial Resource Strain:   .  Difficulty of Paying Living Expenses: Not on file  Food Insecurity:   . Worried About Charity fundraiser in the Last Year: Not on file  . Ran Out of Food in the Last Year: Not on file  Transportation Needs:   . Lack of Transportation (Medical): Not on file  . Lack of Transportation (Non-Medical): Not on file  Physical Activity:   . Days of Exercise per Week: Not on file  . Minutes of Exercise per Session: Not on file  Stress:   . Feeling of Stress : Not on file  Social Connections:   . Frequency of Communication with Friends and Family: Not on file  . Frequency of Social Gatherings with Friends and Family: Not on file  . Attends Religious Services: Not on file  . Active Member of Clubs or Organizations: Not on file  . Attends Archivist Meetings: Not on file  . Marital Status: Not on file  Intimate Partner Violence:   . Fear of Current or Ex-Partner: Not on file  . Emotionally Abused: Not on file  . Physically Abused: Not on file  . Sexually Abused: Not on file    Review of Systems: See HPI, otherwise negative ROS  Physical Exam: BP 113/73   Pulse 66   Temp (!) 97 F (36.1 C) (Temporal)   Resp 16   Ht 5' 5.5" (1.664 m)   Wt 66.2 kg   LMP 08/23/2015 (Exact Date)   SpO2 100%   BMI 23.93 kg/m  General:   Alert,  pleasant and cooperative in NAD Head:  Normocephalic and atraumatic. Neck:  Supple; no masses or thyromegaly. Lungs:  Clear throughout to auscultation, normal respiratory effort.    Heart:  +S1, +S2, Regular rate and rhythm, No edema. Abdomen:  Soft, nontender and nondistended. Normal bowel sounds, without guarding, and without rebound.   Neurologic:  Alert and  oriented x4;  grossly normal neurologically.  Impression/Plan: Ashley Hurst is here for an colonoscopy to be performed for Screening colonoscopy average risk   Risks, benefits, limitations, and alternatives regarding  colonoscopy have been reviewed with the patient.  Questions have been  answered.  All parties agreeable.   Jonathon Bellows, MD  02/23/2020, 10:09 AM

## 2020-02-24 NOTE — Anesthesia Postprocedure Evaluation (Signed)
Anesthesia Post Note  Patient: Ashley Hurst  Procedure(s) Performed: COLONOSCOPY WITH PROPOFOL (N/A )  Patient location during evaluation: Endoscopy Anesthesia Type: General Level of consciousness: awake and alert Pain management: pain level controlled Vital Signs Assessment: post-procedure vital signs reviewed and stable Respiratory status: spontaneous breathing, nonlabored ventilation, respiratory function stable and patient connected to nasal cannula oxygen Cardiovascular status: blood pressure returned to baseline and stable Postop Assessment: no apparent nausea or vomiting Anesthetic complications: no   No complications documented.   Last Vitals:  Vitals:   02/23/20 1029 02/23/20 1039  BP: 108/74 112/71  Pulse: (!) 58 61  Resp: 19 (!) 21  Temp:    SpO2: 99% 100%    Last Pain:  Vitals:   02/23/20 1039  TempSrc:   PainSc: 0-No pain                 Martha Clan

## 2020-03-07 ENCOUNTER — Telehealth: Payer: Self-pay

## 2020-03-07 NOTE — Telephone Encounter (Signed)
Called pt to schedule repeat colonoscopy, per Dr. Vicente Males pt's last bowel prep was suboptimal.  Unable to contact, LVM to return call

## 2020-05-22 DIAGNOSIS — M7541 Impingement syndrome of right shoulder: Secondary | ICD-10-CM | POA: Diagnosis not present

## 2020-08-16 DIAGNOSIS — R030 Elevated blood-pressure reading, without diagnosis of hypertension: Secondary | ICD-10-CM | POA: Diagnosis not present

## 2020-08-19 DIAGNOSIS — E782 Mixed hyperlipidemia: Secondary | ICD-10-CM | POA: Diagnosis not present

## 2020-08-19 DIAGNOSIS — R749 Abnormal serum enzyme level, unspecified: Secondary | ICD-10-CM | POA: Diagnosis not present

## 2020-08-19 DIAGNOSIS — Z1321 Encounter for screening for nutritional disorder: Secondary | ICD-10-CM | POA: Diagnosis not present

## 2020-08-19 DIAGNOSIS — Z131 Encounter for screening for diabetes mellitus: Secondary | ICD-10-CM | POA: Diagnosis not present

## 2020-08-19 DIAGNOSIS — E038 Other specified hypothyroidism: Secondary | ICD-10-CM | POA: Diagnosis not present

## 2020-11-06 DIAGNOSIS — R030 Elevated blood-pressure reading, without diagnosis of hypertension: Secondary | ICD-10-CM | POA: Diagnosis not present

## 2021-01-24 DIAGNOSIS — M7541 Impingement syndrome of right shoulder: Secondary | ICD-10-CM | POA: Diagnosis not present

## 2021-01-24 DIAGNOSIS — M7542 Impingement syndrome of left shoulder: Secondary | ICD-10-CM | POA: Diagnosis not present

## 2021-08-08 ENCOUNTER — Other Ambulatory Visit: Payer: Self-pay | Admitting: Obstetrics and Gynecology

## 2021-08-08 DIAGNOSIS — Z1231 Encounter for screening mammogram for malignant neoplasm of breast: Secondary | ICD-10-CM

## 2021-08-25 ENCOUNTER — Ambulatory Visit
Admission: RE | Admit: 2021-08-25 | Discharge: 2021-08-25 | Disposition: A | Discharge status: Home / Self Care | Payer: Medicare Other | Source: Ambulatory Visit | Attending: Obstetrics and Gynecology | Admitting: Obstetrics and Gynecology

## 2021-08-25 ENCOUNTER — Other Ambulatory Visit: Payer: Self-pay

## 2021-08-25 DIAGNOSIS — Z1231 Encounter for screening mammogram for malignant neoplasm of breast: Secondary | ICD-10-CM | POA: Diagnosis not present

## 2023-11-11 ENCOUNTER — Other Ambulatory Visit: Payer: Self-pay | Admitting: Obstetrics and Gynecology

## 2023-11-11 DIAGNOSIS — Z1231 Encounter for screening mammogram for malignant neoplasm of breast: Secondary | ICD-10-CM

## 2023-12-22 ENCOUNTER — Ambulatory Visit
Admission: RE | Admit: 2023-12-22 | Discharge: 2023-12-22 | Disposition: A | Discharge status: Home / Self Care | Source: Ambulatory Visit | Attending: Obstetrics and Gynecology | Admitting: Obstetrics and Gynecology

## 2023-12-22 DIAGNOSIS — Z1231 Encounter for screening mammogram for malignant neoplasm of breast: Secondary | ICD-10-CM | POA: Diagnosis present
